# Patient Record
Sex: Female | Born: 1975 | Race: Black or African American | Hispanic: No | Smoking: Never smoker
Health system: Southern US, Community
[De-identification: ages and names within clinical notes are randomized; demographics above are authoritative.]

## PROBLEM LIST (undated history)

## (undated) DIAGNOSIS — M109 Gout, unspecified: Secondary | ICD-10-CM

## (undated) DIAGNOSIS — I499 Cardiac arrhythmia, unspecified: Secondary | ICD-10-CM

## (undated) DIAGNOSIS — H6590 Unspecified nonsuppurative otitis media, unspecified ear: Secondary | ICD-10-CM

## (undated) DIAGNOSIS — G629 Polyneuropathy, unspecified: Secondary | ICD-10-CM

## (undated) DIAGNOSIS — G43909 Migraine, unspecified, not intractable, without status migrainosus: Secondary | ICD-10-CM

## (undated) DIAGNOSIS — E785 Hyperlipidemia, unspecified: Secondary | ICD-10-CM

## (undated) DIAGNOSIS — Z8639 Personal history of other endocrine, nutritional and metabolic disease: Secondary | ICD-10-CM

## (undated) DIAGNOSIS — M5430 Sciatica, unspecified side: Secondary | ICD-10-CM

## (undated) DIAGNOSIS — Z5189 Encounter for other specified aftercare: Secondary | ICD-10-CM

## (undated) DIAGNOSIS — G5600 Carpal tunnel syndrome, unspecified upper limb: Secondary | ICD-10-CM

## (undated) DIAGNOSIS — E119 Type 2 diabetes mellitus without complications: Secondary | ICD-10-CM

## (undated) DIAGNOSIS — M5020 Other cervical disc displacement, unspecified cervical region: Secondary | ICD-10-CM

## (undated) DIAGNOSIS — J45909 Unspecified asthma, uncomplicated: Secondary | ICD-10-CM

## (undated) DIAGNOSIS — I10 Essential (primary) hypertension: Secondary | ICD-10-CM

## (undated) HISTORY — PX: ABDOMINAL HYSTERECTOMY: SHX81

## (undated) HISTORY — PX: APPENDECTOMY: SHX54

## (undated) HISTORY — PX: CARPAL TUNNEL RELEASE: SHX101

## (undated) HISTORY — PX: TRIGGER FINGER RELEASE: SHX641

## (undated) HISTORY — PX: CHOLECYSTECTOMY: SHX55

## (undated) HISTORY — DX: Personal history of other endocrine, nutritional and metabolic disease: Z86.39

## (undated) HISTORY — PX: TUBAL LIGATION: SHX77

## (undated) HISTORY — DX: Hyperlipidemia, unspecified: E78.5

## (undated) HISTORY — DX: Cardiac arrhythmia, unspecified: I49.9

---
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
## 1993-03-27 HISTORY — PX: CHOLECYSTECTOMY: SHX55

## 1995-03-28 HISTORY — PX: TUBAL LIGATION: SHX77

## 2006-03-27 HISTORY — PX: CARPAL TUNNEL RELEASE: SHX101

## 2008-03-27 DIAGNOSIS — Z5189 Encounter for other specified aftercare: Secondary | ICD-10-CM

## 2008-03-27 HISTORY — PX: APPENDECTOMY: SHX54

## 2008-03-27 HISTORY — PX: ABDOMINAL HYSTERECTOMY: SHX81

## 2008-03-27 HISTORY — DX: Encounter for other specified aftercare: Z51.89

## 2018-05-20 ENCOUNTER — Emergency Department (HOSPITAL_COMMUNITY)
Admission: EM | Admit: 2018-05-20 | Discharge: 2018-05-20 | Disposition: A | Discharge status: Home / Self Care | Payer: Self-pay | Attending: Emergency Medicine | Admitting: Emergency Medicine

## 2018-05-20 ENCOUNTER — Encounter (HOSPITAL_COMMUNITY): Payer: Self-pay

## 2018-05-20 ENCOUNTER — Other Ambulatory Visit: Payer: Self-pay

## 2018-05-20 DIAGNOSIS — R519 Headache, unspecified: Secondary | ICD-10-CM

## 2018-05-20 DIAGNOSIS — R51 Headache: Secondary | ICD-10-CM | POA: Insufficient documentation

## 2018-05-20 DIAGNOSIS — Z9049 Acquired absence of other specified parts of digestive tract: Secondary | ICD-10-CM | POA: Insufficient documentation

## 2018-05-20 DIAGNOSIS — I10 Essential (primary) hypertension: Secondary | ICD-10-CM | POA: Insufficient documentation

## 2018-05-20 DIAGNOSIS — E119 Type 2 diabetes mellitus without complications: Secondary | ICD-10-CM | POA: Insufficient documentation

## 2018-05-20 DIAGNOSIS — J45909 Unspecified asthma, uncomplicated: Secondary | ICD-10-CM | POA: Insufficient documentation

## 2018-05-20 HISTORY — DX: Carpal tunnel syndrome, unspecified upper limb: G56.00

## 2018-05-20 HISTORY — DX: Unspecified nonsuppurative otitis media, unspecified ear: H65.90

## 2018-05-20 HISTORY — DX: Essential (primary) hypertension: I10

## 2018-05-20 HISTORY — DX: Migraine, unspecified, not intractable, without status migrainosus: G43.909

## 2018-05-20 HISTORY — DX: Sciatica, unspecified side: M54.30

## 2018-05-20 HISTORY — DX: Polyneuropathy, unspecified: G62.9

## 2018-05-20 HISTORY — DX: Gout, unspecified: M10.9

## 2018-05-20 HISTORY — DX: Type 2 diabetes mellitus without complications: E11.9

## 2018-05-20 HISTORY — DX: Other cervical disc displacement, unspecified cervical region: M50.20

## 2018-05-20 HISTORY — DX: Unspecified asthma, uncomplicated: J45.909

## 2018-05-20 LAB — CBG MONITORING, ED: Glucose-Capillary: 80 mg/dL (ref 70–99)

## 2018-05-20 MED ORDER — METOCLOPRAMIDE HCL 5 MG/ML IJ SOLN
10.0000 mg | Freq: Once | INTRAMUSCULAR | Status: AC
  Administered 2018-05-20: 10 mg via INTRAVENOUS
  Filled 2018-05-20: qty 2

## 2018-05-20 MED ORDER — SODIUM CHLORIDE 0.9 % IV BOLUS
1000.0000 mL | Freq: Once | INTRAVENOUS | Status: AC
  Administered 2018-05-20: 1000 mL via INTRAVENOUS

## 2018-05-20 MED ORDER — ONDANSETRON HCL 4 MG/2ML IJ SOLN: 4.0000 mg | Freq: Once | INTRAMUSCULAR | Status: DC

## 2018-05-20 MED ORDER — DIPHENHYDRAMINE HCL 50 MG/ML IJ SOLN
25.0000 mg | Freq: Once | INTRAMUSCULAR | Status: AC
  Administered 2018-05-20: 25 mg via INTRAVENOUS
  Filled 2018-05-20: qty 1

## 2018-05-20 MED ORDER — METOCLOPRAMIDE HCL 10 MG PO TABS: 10.0000 mg | ORAL_TABLET | Freq: Four times a day (QID) | ORAL | 0 refills | Status: DC | PRN

## 2018-05-20 NOTE — ED Triage Notes (Signed)
Pt states that she has had a migraine x 2 months. Pt states that this is worse that it's been. Pt c/o nausea, vomiting with migraines.

## 2018-05-20 NOTE — ED Notes (Signed)
Pt ambulated independently to bathroom for urine sample.

## 2018-05-20 NOTE — Discharge Instructions (Addendum)
Please read instructions below. You can take 600 mg of Advil/ibuprofen every 6 hours as needed for headache. You can take Reglan every 6 hours as needed for headache.  This medication is on the $4 list at [employer], cash price. You can take 25 mg of Benadryl with this to avoid the side effects. Schedule an appointment with your primary care provider to follow up on your headache and discuss preventative treatment. Return to the ER for severely worsening headache, vision changes, fever, weakness or numbness, or new or concerning symptoms.

## 2018-05-20 NOTE — ED Provider Notes (Signed)
[facility]-EMERGENCY DEPT Provider Note   CSN: [id number] Arrival date & time: 05/20/18  1240    History   Chief Complaint Chief Complaint  Patient presents with  . Migraine    HPI Lisa Kennedy is a 43 y.o. female with past medical history of diabetes, asthma, hypertension, migraine headaches, presenting to the emergency department with complaint of constant waxing and waning headache x2 months.  Pain is located to the right side mostly, described as throbbing.  She states she has history of migraine headaches since he was young, normally has about 2 headaches per week.  Has been treating headache at home with ibuprofen, naproxen, Tylenol which eases the headache, however does not completely resolve it.  Patient reports associated photophobia, phonophobia, and intermittent nausea with vomiting.  She states these are her typical symptoms for her migraine headache, the only difference is that her headache is not completely resolving.  [facility], who prescribed her Fioricet, however she cannot afford this medication without insurance.  Prescribed Lortab the past which helped, however she cannot afford this either.  No fever, double vision, slurred speech, facial droop.  No head injury.     The history is provided by the patient.    Past Medical History:  Diagnosis Date  . Asthma   . Carpal tunnel syndrome   . Diabetes mellitus without complication (HCC)   . Fluid collection of middle ear   . Gout   . Herniated disc, cervical   . Hypertension   . Migraine   . Neuropathy   . Sciatica     There are no active problems to display for this patient.   Past Surgical History:  Procedure Laterality Date  . ABDOMINAL HYSTERECTOMY    . APPENDECTOMY    . CARPAL TUNNEL RELEASE    . CHOLECYSTECTOMY    . TRIGGER FINGER RELEASE    . TUBAL LIGATION       OB History   No obstetric history on file.      Home Medications    Prior  to Admission medications   Medication Sig Start Date End Date Taking? Authorizing Provider  metoCLOPramide (REGLAN) 10 MG tablet Take 1 tablet (10 mg total) by mouth every 6 (six) hours as needed (headache or nausea). 05/20/18   Nikolos Billig, [place] N, PA-C    Family History No family history on file.  Social History Social History   Tobacco Use  . Smoking status: Never Smoker  . Smokeless tobacco: Never Used  Substance Use Topics  . Alcohol use: Never    Frequency: Never  . Drug use: Never     Allergies   Imitrex [sumatriptan]; Morphine and related; Penicillins; and Toradol [ketorolac tromethamine]   Review of Systems Review of Systems  Constitutional: Negative for fever.  Eyes: Positive for photophobia. Negative for visual disturbance.  Gastrointestinal: Positive for nausea and vomiting. Negative for abdominal pain.  Neurological: Positive for headaches.  All other systems reviewed and are negative.    Physical Exam Updated Vital Signs BP (!) 145/92   Pulse 98   Temp 98.2 F (36.8 C) (Oral)   Resp 16   Ht 5' 4.5" (1.638 m)   Wt 129.6 kg   SpO2 99%   BMI 48.30 kg/m   Physical Exam Vitals signs and nursing note reviewed.  Constitutional:      General: She is not in acute distress.    Appearance: She is well-developed. She is not ill-appearing.  Comments: Morbidly obese  HENT:     Head: Normocephalic and atraumatic.  Eyes:     Extraocular Movements: Extraocular movements intact.     Conjunctiva/sclera: Conjunctivae normal.     Pupils: Pupils are equal, round, and reactive to light.  Neck:     Musculoskeletal: Normal range of motion and neck supple. No neck rigidity.  Cardiovascular:     Rate and Rhythm: Normal rate and regular rhythm.  Pulmonary:     Effort: Pulmonary effort is normal. No respiratory distress.     Breath sounds: Normal breath sounds.  Abdominal:     General: Bowel sounds are normal. There is no distension.     Palpations: Abdomen is  soft.     Tenderness: There is no abdominal tenderness. There is no guarding.  Skin:    General: Skin is warm.  Neurological:     Mental Status: She is alert.     Comments: Mental Status:  Alert, oriented, thought content appropriate, able to give a coherent history. Speech fluent without evidence of aphasia. Able to follow 2 step commands without difficulty.  Cranial Nerves:  II:  Peripheral visual fields grossly normal, pupils equal, round, reactive to light III,IV, VI: ptosis not present, extra-ocular motions intact bilaterally  V,VII: smile symmetric, facial light touch sensation equal VIII: hearing grossly normal to voice  X: uvula elevates symmetrically  XI: bilateral shoulder shrug symmetric and strong XII: midline tongue extension without fassiculations Motor:  Normal tone. 5/5 in upper and lower extremities bilaterally including strong and equal grip strength and dorsiflexion/plantar flexion Sensory: Pinprick and light touch normal in all extremities.  Cerebellar: normal finger-to-nose with bilateral upper extremities Normal gait CV: distal pulses palpable throughout    Psychiatric:        Behavior: Behavior normal.      ED Treatments / Results  Labs (all labs ordered are listed, but only abnormal results are displayed) Labs Reviewed  CBG MONITORING, ED    EKG None  Radiology No results found.  Procedures Procedures (including critical care time)  Medications Ordered in ED Medications  sodium chloride 0.9 % bolus 1,000 mL (0 mLs Intravenous Stopped 05/20/18 1848)  metoCLOPramide (REGLAN) injection 10 mg (10 mg Intravenous Given 05/20/18 1729)  diphenhydrAMINE (BENADRYL) injection 25 mg (25 mg Intravenous Given 05/20/18 1729)     Initial Impression / Assessment and Plan / ED Course  I have reviewed the triage vital signs and the nursing notes.  Pertinent labs & imaging results that were available during my care of the patient were reviewed by me and  considered in my medical decision making (see chart for details).  Clinical Course as of May 20 1938  Mon May 20, 2018  1701 2 months waxing and waning headache. Iburpfoen., naproxdn, tylenol. Eases but not rseolved. Photophobia 1.16months, phono. N/v started 3 weeks, comes and goes. Prior to this about 2 ha per week. Managed in [place]. Loratab for headache, no insurance so can't afford. [facility]cp at [facility] -fioricet.    [JR]  1704 Int blurryvision. No diplopia. No fever. No slurred speech or facial droop.   [JR]  1705 Right side mostly thobbing   [JR]  1900 Patient reevaluated.  Reports complete resolution in symptoms and is ready for discharge.   [JR]    Clinical Course User Index [JR] Verneda Hollopeter, [place] N, PA-C       Pt w hx migraine HA, presenting with the same. Persistent waxing/waning sx x 2 months.  HA treated and  resolved while in ED.  Presentation is like pts typical HA and non concerning for [facility], ICH, Meningitis, or temporal arteritis. Pt is afebrile with no focal neuro deficits, nuchal rigidity, or change in vision. Pt is to follow up with PCP to discuss prophylactic medication. Pt verbalizes understanding and is agreeable with plan to dc.   Discussed results, findings, treatment and follow up. Patient advised of return precautions. Patient verbalized understanding and agreed with plan.  Final Clinical Impressions(s) / ED Diagnoses   Final diagnoses:  Acute nonintractable headache, unspecified headache type    ED Discharge Orders         Ordered    metoCLOPramide (REGLAN) 10 MG tablet  Every 6 hours PRN     05/20/18 1902           Keyondra Lagrand, [place] N, PA-C 05/20/18 1941    Mancel Bale, MD 05/21/18 [phone]

## 2018-06-04 ENCOUNTER — Other Ambulatory Visit (HOSPITAL_COMMUNITY): Payer: Self-pay | Admitting: *Deleted

## 2018-06-04 ENCOUNTER — Telehealth (HOSPITAL_COMMUNITY): Payer: Self-pay

## 2018-06-04 DIAGNOSIS — N644 Mastodynia: Secondary | ICD-10-CM

## 2018-06-04 NOTE — Telephone Encounter (Signed)
Returned patient's phone call. Left name and number for her to call back. °

## 2018-07-22 ENCOUNTER — Telehealth (HOSPITAL_COMMUNITY): Payer: Self-pay | Admitting: *Deleted

## 2018-07-22 NOTE — Telephone Encounter (Signed)
Telephoned patient at home number and left message to return call to [facility]. Need to speak to patient prior to appointment otherwise will cancel appointment.  

## 2018-07-23 ENCOUNTER — Other Ambulatory Visit: Payer: Self-pay

## 2018-07-23 ENCOUNTER — Ambulatory Visit (HOSPITAL_COMMUNITY): Payer: Self-pay

## 2018-07-23 ENCOUNTER — Inpatient Hospital Stay: Admission: RE | Admit: 2018-07-23 | Payer: Self-pay | Source: Ambulatory Visit

## 2018-07-25 ENCOUNTER — Other Ambulatory Visit: Payer: Self-pay

## 2018-10-31 ENCOUNTER — Other Ambulatory Visit: Payer: Self-pay

## 2018-10-31 ENCOUNTER — Ambulatory Visit (HOSPITAL_COMMUNITY): Payer: Self-pay

## 2018-11-20 ENCOUNTER — Emergency Department (HOSPITAL_COMMUNITY)
Admission: EM | Admit: 2018-11-20 | Discharge: 2018-11-20 | Disposition: A | Discharge status: Home / Self Care | Payer: 59 | Attending: Emergency Medicine | Admitting: Emergency Medicine

## 2018-11-20 ENCOUNTER — Other Ambulatory Visit: Payer: Self-pay

## 2018-11-20 ENCOUNTER — Encounter (HOSPITAL_COMMUNITY): Payer: Self-pay | Admitting: Emergency Medicine

## 2018-11-20 ENCOUNTER — Emergency Department (HOSPITAL_COMMUNITY): Payer: 59

## 2018-11-20 DIAGNOSIS — Z794 Long term (current) use of insulin: Secondary | ICD-10-CM | POA: Diagnosis not present

## 2018-11-20 DIAGNOSIS — I10 Essential (primary) hypertension: Secondary | ICD-10-CM | POA: Insufficient documentation

## 2018-11-20 DIAGNOSIS — Z79899 Other long term (current) drug therapy: Secondary | ICD-10-CM | POA: Diagnosis not present

## 2018-11-20 DIAGNOSIS — Z20828 Contact with and (suspected) exposure to other viral communicable diseases: Secondary | ICD-10-CM | POA: Diagnosis not present

## 2018-11-20 DIAGNOSIS — E119 Type 2 diabetes mellitus without complications: Secondary | ICD-10-CM | POA: Insufficient documentation

## 2018-11-20 DIAGNOSIS — R112 Nausea with vomiting, unspecified: Secondary | ICD-10-CM | POA: Diagnosis present

## 2018-11-20 DIAGNOSIS — J45909 Unspecified asthma, uncomplicated: Secondary | ICD-10-CM | POA: Diagnosis not present

## 2018-11-20 DIAGNOSIS — B349 Viral infection, unspecified: Secondary | ICD-10-CM | POA: Diagnosis not present

## 2018-11-20 LAB — BASIC METABOLIC PANEL
Anion gap: 16 — ABNORMAL HIGH (ref 5–15)
BUN: 17 mg/dL (ref 6–20)
CO2: 24 mmol/L (ref 22–32)
Calcium: 9 mg/dL (ref 8.9–10.3)
Chloride: 96 mmol/L — ABNORMAL LOW (ref 98–111)
Creatinine, Ser: 1.35 mg/dL — ABNORMAL HIGH (ref 0.44–1.00)
GFR calc Af Amer: 55 mL/min — ABNORMAL LOW (ref >60)
GFR calc non Af Amer: 47 mL/min — ABNORMAL LOW (ref >60)
Glucose, Bld: 252 mg/dL — ABNORMAL HIGH (ref 70–99)
Potassium: 3.7 mmol/L (ref 3.5–5.1)
Sodium: 136 mmol/L (ref 135–145)

## 2018-11-20 LAB — TROPONIN I (HIGH SENSITIVITY)
Troponin I (High Sensitivity): 4 ng/L (ref <18)
Troponin I (High Sensitivity): 3 ng/L (ref <18)

## 2018-11-20 LAB — CBC
HCT: 39.7 % (ref 36.0–46.0)
Hemoglobin: 12.7 g/dL (ref 12.0–15.0)
MCH: 28.1 pg (ref 26.0–34.0)
MCHC: 32 g/dL (ref 30.0–36.0)
MCV: 87.8 fL (ref 80.0–100.0)
Platelets: 341 10*3/uL (ref 150–400)
RBC: 4.52 MIL/uL (ref 3.87–5.11)
RDW: 12.9 % (ref 11.5–15.5)
WBC: 6.3 10*3/uL (ref 4.0–10.5)
nRBC: 0 % (ref 0.0–0.2)

## 2018-11-20 LAB — I-STAT BETA HCG BLOOD, ED (MC, WL, AP ONLY): I-stat hCG, quantitative: <5 m[IU]/mL (ref <5)

## 2018-11-20 MED ORDER — SODIUM CHLORIDE 0.9 % IV BOLUS
1000.0000 mL | Freq: Once | INTRAVENOUS | Status: AC
  Administered 2018-11-20: 1000 mL via INTRAVENOUS

## 2018-11-20 MED ORDER — ONDANSETRON HCL 4 MG/2ML IJ SOLN
4.0000 mg | Freq: Once | INTRAMUSCULAR | Status: AC
  Administered 2018-11-20: 4 mg via INTRAVENOUS
  Filled 2018-11-20: qty 2

## 2018-11-20 MED ORDER — ONDANSETRON HCL 4 MG PO TABS: 4.0000 mg | ORAL_TABLET | Freq: Four times a day (QID) | ORAL | 0 refills | Status: DC

## 2018-11-20 MED ORDER — SODIUM CHLORIDE 0.9% FLUSH
3.0000 mL | Freq: Once | INTRAVENOUS | Status: AC
  Administered 2018-11-20: 3 mL via INTRAVENOUS

## 2018-11-20 NOTE — ED Triage Notes (Signed)
Pt here from home with c/o sob and chest pain , pt has asthma and has been using her inhaler without relief pt.'s daughter has been tested for covid awaiting results

## 2018-11-20 NOTE — Discharge Instructions (Signed)
Please read attached information. If you experience any new or worsening signs or symptoms please return to the emergency room for evaluation. Please follow-up with your primary care provider or specialist as discussed. Please use medication prescribed only as directed and discontinue taking if you have any concerning signs or symptoms.   °

## 2018-11-20 NOTE — ED Notes (Signed)
Patient verbalizes understanding of discharge instructions. Opportunity for questioning and answers were provided. Armband removed by staff, pt discharged from ED.  

## 2018-11-20 NOTE — ED Provider Notes (Signed)
[facility] EMERGENCY DEPARTMENT Provider Note   CSN: [id number] Arrival date & time: 11/20/18  1129     History   Chief Complaint No chief complaint on file.   HPI Lisa Kennedy is a 43 y.o. female.     HPI  43 year old female presents today with numerous complaints.  Patient notes approximate 1 week ago she developed nausea vomiting diarrhea.  She notes that the vomiting and diarrhea is gone away but she still feels nauseous.  She is able to eat and drink.  She notes 3 days ago she developed cough with minimal shortness of breath, the shortness of breath is only present when she is talking and not at rest.  She denies any lower extremity edema she notes intermittent ache in her middle chest.  She denies any fever.  She denies any recent close contact with COVID positive patients but does note that her daughter currently has URI and is being tested.  She is a diabetic and did not take her insulin this morning.  She notes she is slightly thirsty.  Past Medical History:  Diagnosis Date  . Asthma   . Blood transfusion without reported diagnosis 03/2008   During Hysterectomy   . Carpal tunnel syndrome   . Diabetes mellitus without complication (HCC)   . Fluid collection of middle ear   . Gout   . Herniated disc, cervical   . Hypertension   . Migraine   . Neuropathy   . Sciatica     There are no active problems to display for this patient.   Past Surgical History:  Procedure Laterality Date  . ABDOMINAL HYSTERECTOMY    . APPENDECTOMY    . CARPAL TUNNEL RELEASE    . CHOLECYSTECTOMY    . TRIGGER FINGER RELEASE    . TUBAL LIGATION       OB History   No obstetric history on file.      Home Medications    Prior to Admission medications   Medication Sig Start Date End Date Taking? Authorizing Provider  allopurinol (ZYLOPRIM) 100 MG tablet Take 100 mg by mouth daily.   Yes [provider]  atorvastatin (LIPITOR) 40 MG tablet Take 40 mg by  mouth daily.   Yes [provider]  cetirizine (ZYRTEC) 10 MG tablet Take 10 mg by mouth daily.   Yes [provider]  cyclobenzaprine (FLEXERIL) 10 MG tablet Take 10 mg by mouth daily.   Yes [provider]  diphenhydrAMINE (BENADRYL) 25 MG tablet Take 25 mg by mouth every 6 (six) hours as needed for allergies.   Yes [provider]  famotidine (PEPCID) 40 MG tablet Take 40 mg by mouth 2 (two) times daily.   Yes [provider]  gabapentin (NEURONTIN) 300 MG capsule Take 300 mg by mouth 4 (four) times daily.   Yes [provider]  insulin aspart (NOVOLOG FLEXPEN) 100 UNIT/ML FlexPen Inject 2-14 Units into the skin 3 (three) times daily with meals. Sliding Scale   Yes [provider]  insulin glargine (LANTUS) 100 UNIT/ML injection Inject 40 Units into the skin at bedtime.   Yes [provider]  lisinopril-hydrochlorothiazide (ZESTORETIC) 20-12.5 MG tablet Take 1 tablet by mouth 2 (two) times daily.   Yes [provider]  metFORMIN (GLUCOPHAGE) 1000 MG tablet Take 1,000 mg by mouth 2 (two) times daily with a meal.   Yes [provider]  metoCLOPramide (REGLAN) 10 MG tablet Take 1 tablet (10 mg total) by  mouth every 6 (six) hours as needed (headache or nausea). 05/20/18  Yes Robinson, [place] N, PA-C  metoprolol tartrate (LOPRESSOR) 25 MG tablet Take 25 mg by mouth 2 (two) times daily.   Yes [provider]  montelukast (SINGULAIR) 10 MG tablet Take 10 mg by mouth daily.   Yes [provider]  Vitamin D, Ergocalciferol, (DRISDOL) 1.25 MG (50000 UT) CAPS capsule Take 50,000 Units by mouth every 7 (seven) days.   Yes [provider]  ondansetron (ZOFRAN) 4 MG tablet Take 1 tablet (4 mg total) by mouth every 6 (six) hours. 11/20/18   Okey Regal, PA-C    Family History History reviewed. No pertinent family history.  Social History Social History   Tobacco Use  . Smoking status:  Never Smoker  . Smokeless tobacco: Never Used  Substance Use Topics  . Alcohol use: Never    Frequency: Never  . Drug use: Never     Allergies   Coconut oil, Imitrex [sumatriptan], Morphine and related, Penicillins, and Toradol [ketorolac tromethamine]   Review of Systems Review of Systems  All other systems reviewed and are negative.    Physical Exam Updated Vital Signs BP 110/62   Pulse 95   Temp 98.8 F (37.1 C) (Bladder)   Resp 20   Ht 5' 4.5" (1.638 m)   Wt 127.5 kg   SpO2 100%   BMI 47.49 kg/m   Physical Exam Vitals signs and nursing note reviewed.  Constitutional:      Appearance: She is well-developed.  HENT:     Head: Normocephalic and atraumatic.  Eyes:     General: No scleral icterus.       Right eye: No discharge.        Left eye: No discharge.     Conjunctiva/sclera: Conjunctivae normal.     Pupils: Pupils are equal, round, and reactive to light.  Neck:     Musculoskeletal: Normal range of motion.     Vascular: No JVD.     Trachea: No tracheal deviation.  Pulmonary:     Effort: Pulmonary effort is normal. No respiratory distress.     Breath sounds: Normal breath sounds. No stridor. No wheezing, rhonchi or rales.  Chest:     Chest wall: No tenderness.  Musculoskeletal:     Comments: No edema to the lower extremities  Neurological:     Mental Status: She is alert and oriented to person, place, and time.     Coordination: Coordination normal.  Psychiatric:        Behavior: Behavior normal.        Thought Content: Thought content normal.        Judgment: Judgment normal.      ED Treatments / Results  Labs (all labs ordered are listed, but only abnormal results are displayed) Labs Reviewed  BASIC METABOLIC PANEL - Abnormal; Notable for the following components:      Result Value   Chloride 96 (*)    Glucose, Bld 252 (*)    Creatinine, Ser 1.35 (*)    GFR calc non Af Amer 47 (*)    GFR calc Af Amer 55 (*)    Anion gap 16 (*)    All  other components within normal limits  NOVEL CORONAVIRUS, NAA (HOSP ORDER, SEND-OUT TO REF LAB; TAT 18-24 HRS)  CBC  I-STAT BETA HCG BLOOD, ED ([facility], [facility], [facility] ONLY)  TROPONIN I (HIGH SENSITIVITY)  TROPONIN I (HIGH SENSITIVITY)    EKG None  Radiology  Dg Chest Port 1 View  Result Date: 11/20/2018 CLINICAL DATA:  Chest pain, cough EXAM: PORTABLE CHEST 1 VIEW COMPARISON:  None. FINDINGS: The heart size and mediastinal contours are within normal limits. Both lungs are clear. The visualized skeletal structures are unremarkable. IMPRESSION: No acute abnormality of the lungs in AP portable projection. Electronically Signed   By: Lauralyn PrimesAlex  Bibbey M.D.   On: 11/20/2018 13:41    Procedures Procedures (including critical care time)  Medications Ordered in ED Medications  sodium chloride flush (NS) 0.9 % injection 3 mL (3 mLs Intravenous Given 11/20/18 1358)  sodium chloride 0.9 % bolus 1,000 mL (0 mLs Intravenous Stopped 11/20/18 1553)  ondansetron (ZOFRAN) injection 4 mg (4 mg Intravenous Given 11/20/18 1357)     Initial Impression / Assessment and Plan / ED Course  I have reviewed the triage vital signs and the nursing notes.  Pertinent labs & imaging results that were available during my care of the patient were reviewed by me and considered in my medical decision making (see chart for details).          Assessment/Plan: 43 year old female presents today with likely viral illness.  She appears very well in no acute distress presently.  She has clear lung sounds speaking full sentences with no signs of respiratory distress, vital signs reassuring with oxygen at 100%.  Chest x-ray without acute findings.  Patient did have nausea vomiting diarrhea followed by upper respiratory symptoms giving rise to viral illness.  Will test for COVID.  Patient will return immediately if she develops any new or worsening signs or symptoms.  Verbalized understanding and agreement to today's plan had no further  questions concerns at time of discharge.  Lisa Kennedy was evaluated in Emergency Department on 11/20/2018 for the symptoms described in the history of present illness. She was evaluated in the context of the global COVID-19 pandemic, which necessitated consideration that the patient might be at risk for infection with the SARS-CoV-2 virus that causes COVID-19. Institutional protocols and algorithms that pertain to the evaluation of patients at risk for COVID-19 are in a state of rapid change based on information released by regulatory bodies including the CDC and federal and state organizations. These policies and algorithms were followed during the patient's care in the ED.      Final Clinical Impressions(s) / ED Diagnoses   Final diagnoses:  Viral illness    ED Discharge Orders         Ordered    ondansetron (ZOFRAN) 4 MG tablet  Every 6 hours     11/20/18 1617           Eyvonne MechanicHedges, Juandaniel Manfredo, PA-C 11/20/18 1619    Loren RacerYelverton, David, MD 11/25/18 2232

## 2018-11-21 LAB — NOVEL CORONAVIRUS, NAA (HOSP ORDER, SEND-OUT TO REF LAB; TAT 18-24 HRS): SARS-CoV-2, NAA: NOT DETECTED

## 2019-01-28 ENCOUNTER — Other Ambulatory Visit: Payer: Self-pay

## 2019-01-28 ENCOUNTER — Ambulatory Visit (HOSPITAL_COMMUNITY)
Admission: RE | Admit: 2019-01-28 | Discharge: 2019-01-28 | Disposition: A | Discharge status: Home / Self Care | Payer: 59 | Source: Ambulatory Visit | Attending: Obstetrics and Gynecology | Admitting: Obstetrics and Gynecology

## 2019-01-28 ENCOUNTER — Encounter (HOSPITAL_COMMUNITY): Payer: Self-pay

## 2019-01-28 ENCOUNTER — Ambulatory Visit
Admission: RE | Admit: 2019-01-28 | Discharge: 2019-01-28 | Disposition: A | Discharge status: Home / Self Care | Payer: No Typology Code available for payment source | Source: Ambulatory Visit | Attending: Obstetrics and Gynecology | Admitting: Obstetrics and Gynecology

## 2019-01-28 ENCOUNTER — Ambulatory Visit
Admission: RE | Admit: 2019-01-28 | Discharge: 2019-01-28 | Disposition: A | Discharge status: Home / Self Care | Payer: 59 | Source: Ambulatory Visit | Attending: Obstetrics and Gynecology | Admitting: Obstetrics and Gynecology

## 2019-01-28 ENCOUNTER — Other Ambulatory Visit (HOSPITAL_COMMUNITY): Payer: Self-pay | Admitting: Obstetrics and Gynecology

## 2019-01-28 DIAGNOSIS — N644 Mastodynia: Secondary | ICD-10-CM

## 2019-01-28 DIAGNOSIS — Z1239 Encounter for other screening for malignant neoplasm of breast: Secondary | ICD-10-CM | POA: Insufficient documentation

## 2019-01-28 DIAGNOSIS — N6320 Unspecified lump in the left breast, unspecified quadrant: Secondary | ICD-10-CM

## 2019-01-28 NOTE — Progress Notes (Signed)
Complaints of bilateral breast pain x 6 months that is within the left outer and right nipple area. Patient states the pain comes and goes. Patient rates the pain at a 6 out of 10.  Pap Smear: Pap smear not completed today. Last Pap smear was in March 2020 at [facility] and normal per patient. Per patient has no history of an abnormal Pap smear. Patient stated she had a hysterectomy in 2010 for AUB and that she still has a cervix. No Pap smear results are in Epic.  Physical exam: Breasts Breasts symmetrical. No skin abnormalities bilateral breasts. No nipple retraction bilateral breasts. No nipple discharge bilateral breasts. No lymphadenopathy. No lumps palpated bilateral breasts. Complaints of left outer and right upper breast tenderness on exam. Referred patient to the [facility] for a diagnostic mammogram. Appointment scheduled for Tuesday, January 28, 2019 at 0950.        Pelvic/Bimanual No Pap smear completed today since last Pap smear was in March 2020 per patient. Pap smear not indicated per [facility] guidelines.   Smoking History: Patient has never smoked.  Patient Navigation: Patient education provided. Access to services provided for patient through [facility] program.   Breast and Cervical Cancer Risk Assessment: Patient has a family history of her maternal grandmother and maternal great grandmother having breast cancer. Patient has no known genetic mutations or history of radiation treatment to the chest before age 52. Patient has no history of cervical dysplasia, immunocompromised, or DES exposure in-utero.  Risk Assessment    Risk Scores      01/28/2019   Last edited by: Loletta Parish, RN   5-year risk: 0.9 %   Lifetime risk: 9.3 %

## 2019-01-28 NOTE — Patient Instructions (Signed)
Explained breast self awareness with [employer]. Patient did not need a Pap smear today due to last Pap smear was in March 2020 per patient. Let her know [facility] will cover Pap smears every 3 years unless has a history of abnormal Pap smears. Referred patient to the [facility] for a diagnostic mammogram. Appointment scheduled for Tuesday, January 28, 2019 at 0950. Patient aware of appointment and will be there. [employer] verbalized understanding.  Caydn Justen, Arvil Chaco, RN 8:21 AM

## 2019-07-29 ENCOUNTER — Other Ambulatory Visit: Payer: No Typology Code available for payment source

## 2019-08-01 ENCOUNTER — Other Ambulatory Visit: Payer: Self-pay

## 2019-08-01 ENCOUNTER — Encounter (HOSPITAL_COMMUNITY): Payer: Self-pay | Admitting: Emergency Medicine

## 2019-08-01 ENCOUNTER — Emergency Department (HOSPITAL_COMMUNITY)
Admission: EM | Admit: 2019-08-01 | Discharge: 2019-08-01 | Disposition: A | Discharge status: Home / Self Care | Payer: 59 | Attending: Emergency Medicine | Admitting: Emergency Medicine

## 2019-08-01 DIAGNOSIS — E119 Type 2 diabetes mellitus without complications: Secondary | ICD-10-CM | POA: Diagnosis not present

## 2019-08-01 DIAGNOSIS — R350 Frequency of micturition: Secondary | ICD-10-CM | POA: Insufficient documentation

## 2019-08-01 DIAGNOSIS — E1165 Type 2 diabetes mellitus with hyperglycemia: Secondary | ICD-10-CM | POA: Diagnosis not present

## 2019-08-01 DIAGNOSIS — Z794 Long term (current) use of insulin: Secondary | ICD-10-CM | POA: Diagnosis not present

## 2019-08-01 DIAGNOSIS — H538 Other visual disturbances: Secondary | ICD-10-CM | POA: Diagnosis not present

## 2019-08-01 DIAGNOSIS — R1013 Epigastric pain: Secondary | ICD-10-CM | POA: Diagnosis present

## 2019-08-01 DIAGNOSIS — R112 Nausea with vomiting, unspecified: Secondary | ICD-10-CM | POA: Diagnosis not present

## 2019-08-01 DIAGNOSIS — Z79899 Other long term (current) drug therapy: Secondary | ICD-10-CM | POA: Insufficient documentation

## 2019-08-01 DIAGNOSIS — I10 Essential (primary) hypertension: Secondary | ICD-10-CM | POA: Insufficient documentation

## 2019-08-01 LAB — URINALYSIS, ROUTINE W REFLEX MICROSCOPIC
Bacteria, UA: NONE SEEN
Bilirubin Urine: NEGATIVE
Glucose, UA: >=500 mg/dL — AB
Hgb urine dipstick: NEGATIVE
Ketones, ur: NEGATIVE mg/dL
Leukocytes,Ua: NEGATIVE
Nitrite: NEGATIVE
Protein, ur: NEGATIVE mg/dL
Specific Gravity, Urine: 1.026 (ref 1.005–1.030)
pH: 5 (ref 5.0–8.0)

## 2019-08-01 LAB — BASIC METABOLIC PANEL
Anion gap: 15 (ref 5–15)
BUN: 22 mg/dL — ABNORMAL HIGH (ref 6–20)
CO2: 25 mmol/L (ref 22–32)
Calcium: 9.7 mg/dL (ref 8.9–10.3)
Chloride: 94 mmol/L — ABNORMAL LOW (ref 98–111)
Creatinine, Ser: 1.04 mg/dL — ABNORMAL HIGH (ref 0.44–1.00)
GFR calc Af Amer: >60 mL/min (ref >60)
GFR calc non Af Amer: >60 mL/min (ref >60)
Glucose, Bld: 258 mg/dL — ABNORMAL HIGH (ref 70–99)
Potassium: 3.5 mmol/L (ref 3.5–5.1)
Sodium: 134 mmol/L — ABNORMAL LOW (ref 135–145)

## 2019-08-01 LAB — CBC
HCT: 42.7 % (ref 36.0–46.0)
Hemoglobin: 13.7 g/dL (ref 12.0–15.0)
MCH: 27.5 pg (ref 26.0–34.0)
MCHC: 32.1 g/dL (ref 30.0–36.0)
MCV: 85.7 fL (ref 80.0–100.0)
Platelets: 421 10*3/uL — ABNORMAL HIGH (ref 150–400)
RBC: 4.98 MIL/uL (ref 3.87–5.11)
RDW: 13.7 % (ref 11.5–15.5)
WBC: 6.9 10*3/uL (ref 4.0–10.5)
nRBC: 0 % (ref 0.0–0.2)

## 2019-08-01 LAB — LIPASE, BLOOD: Lipase: 34 U/L (ref 11–51)

## 2019-08-01 LAB — HEPATIC FUNCTION PANEL
ALT: 13 U/L (ref 0–44)
AST: 16 U/L (ref 15–41)
Albumin: 3.6 g/dL (ref 3.5–5.0)
Alkaline Phosphatase: 93 U/L (ref 38–126)
Bilirubin, Direct: 0.1 mg/dL (ref 0.0–0.2)
Indirect Bilirubin: 0.4 mg/dL (ref 0.3–0.9)
Total Bilirubin: 0.5 mg/dL (ref 0.3–1.2)
Total Protein: 7.6 g/dL (ref 6.5–8.1)

## 2019-08-01 LAB — CBG MONITORING, ED
Glucose-Capillary: 278 mg/dL — ABNORMAL HIGH (ref 70–99)
Glucose-Capillary: 268 mg/dL — ABNORMAL HIGH (ref 70–99)

## 2019-08-01 MED ORDER — OMEPRAZOLE 20 MG PO CPDR: 20.0000 mg | DELAYED_RELEASE_CAPSULE | Freq: Every day | ORAL | 0 refills | Status: DC

## 2019-08-01 MED ORDER — ONDANSETRON HCL 4 MG/2ML IJ SOLN
4.0000 mg | Freq: Once | INTRAMUSCULAR | Status: AC
  Administered 2019-08-01: 22:00:00 4 mg via INTRAVENOUS
  Filled 2019-08-01: qty 2

## 2019-08-01 MED ORDER — SODIUM CHLORIDE 0.9 % IV SOLN: 1000.0000 mL | INTRAVENOUS | Status: DC

## 2019-08-01 MED ORDER — ONDANSETRON 4 MG PO TBDP: 4.0000 mg | ORAL_TABLET | ORAL | 0 refills | Status: DC | PRN

## 2019-08-01 MED ORDER — SODIUM CHLORIDE 0.9 % IV BOLUS (SEPSIS)
1000.0000 mL | Freq: Once | INTRAVENOUS | Status: AC
  Administered 2019-08-01: 22:00:00 1000 mL via INTRAVENOUS

## 2019-08-01 MED ORDER — PANTOPRAZOLE SODIUM 40 MG IV SOLR
40.0000 mg | Freq: Once | INTRAVENOUS | Status: AC
  Administered 2019-08-01: 22:00:00 40 mg via INTRAVENOUS
  Filled 2019-08-01: qty 40

## 2019-08-01 NOTE — Discharge Instructions (Addendum)
1.  Continue your for mode of pain twice daily.  Take omeprazole once daily for the next 1 to 2 weeks.  Take Zofran if needed for nausea. 2.  Take your Lantus this evening and then resume your NovoLog tomorrow as prescribed. 3.  Return to the emergency department if you have persistent vomiting, dehydration, feel weak have abdominal pain fever or other concerning symptoms.

## 2019-08-01 NOTE — ED Triage Notes (Signed)
Pt arrives to ED from her PCP with complaints of hyperglycemia. Patient states she hasnt been able to take her Novolog for three weeks due to her pharmacy. Patients BS at her PCP was over 500. Patient was able to get 20units novolog today at PCP office but is here for possible DKA. Patient has been fatigued and weak all week.

## 2019-08-01 NOTE — ED Provider Notes (Signed)
[facility] EMERGENCY DEPARTMENT Provider Note   CSN: [id number] Arrival date & time: 08/01/19  1653     History Chief Complaint  Patient presents with  . Hyperglycemia    Lisa Kennedy is a 44 y.o. female.  HPI Patient reports due to complications or miscommunications between the pharmacy and her primary care office, if she has gone 3 weeks without her NovoLog insulin.  Patient reports this is the third time the situation has arisen.  She reports she has been making phone calls and trying to resolve the situation.  She had an appointment with her provider today.  In the office they found her blood sugar to be greater than 500.  She was given 20 unit dose of NovoLog at PCP office.  She was referred to the emergency department for concern of possible DKA.  Patient reports that she has been having vomiting up to 3-4 times a day for the past week.  She reports it usually occurs if she tries to eat something.  Just today she had a first episode of loose stool.  She reports she has had some epigastric discomfort but no significant abdominal pain.  Reports this does typically happen when her blood sugar start to get out of control.  Reports she is also noticed some blurred vision and frequent urination.  No fevers no chills.  No cough.  No shortness of breath no chest pain.    Past Medical History:  Diagnosis Date  . Asthma   . Blood transfusion without reported diagnosis 03/2008   During Hysterectomy   . Carpal tunnel syndrome   . Diabetes mellitus without complication (HCC)   . Fluid collection of middle ear   . Gout   . Herniated disc, cervical   . Hypertension   . Migraine   . Neuropathy   . Sciatica     Patient Active Problem List   Diagnosis Date Noted  . Screening breast examination 01/28/2019  . Breast pain 01/28/2019    Past Surgical History:  Procedure Laterality Date  . ABDOMINAL HYSTERECTOMY    . APPENDECTOMY    . CARPAL TUNNEL RELEASE    .  CHOLECYSTECTOMY    . TRIGGER FINGER RELEASE    . TUBAL LIGATION       OB History    Gravida  2   Para      Term      Preterm      AB      Living        SAB      TAB      Ectopic      Multiple      Live Births  2           Family History  Problem Relation Age of Onset  . Hypokalemia Mother   . Gout Mother   . Breast cancer Maternal Grandmother   . Diabetes Maternal Great-grandmother     Social History   Tobacco Use  . Smoking status: Never Smoker  . Smokeless tobacco: Never Used  Substance Use Topics  . Alcohol use: Yes    Comment: occassionally  . Drug use: Never    Home Medications Prior to Admission medications   Medication Sig Start Date End Date Taking? Authorizing Provider  allopurinol (ZYLOPRIM) 100 MG tablet Take 100 mg by mouth daily.    [provider]  atorvastatin (LIPITOR) 40 MG tablet Take 40 mg by mouth daily.    [provider]  cetirizine (ZYRTEC) 10 MG tablet Take 10 mg by mouth daily.    [provider]  cyclobenzaprine (FLEXERIL) 10 MG tablet Take 10 mg by mouth daily.    [provider]  diphenhydrAMINE (BENADRYL) 25 MG tablet Take 25 mg by mouth every 6 (six) hours as needed for allergies.    [provider]  famotidine (PEPCID) 40 MG tablet Take 40 mg by mouth 2 (two) times daily.    [provider]  gabapentin (NEURONTIN) 300 MG capsule Take 300 mg by mouth 4 (four) times daily.    [provider]  insulin aspart (NOVOLOG FLEXPEN) 100 UNIT/ML FlexPen Inject 2-14 Units into the skin 3 (three) times daily with meals. Sliding Scale    [provider]  insulin glargine (LANTUS) 100 UNIT/ML injection Inject 40 Units into the skin at bedtime.    [provider]  lisinopril-hydrochlorothiazide (ZESTORETIC) 20-12.5 MG tablet Take 1 tablet by mouth 2 (two) times daily.    [provider]  metFORMIN (GLUCOPHAGE) 1000 MG tablet Take 1,000 mg by mouth  2 (two) times daily with a meal.    [provider]  metoCLOPramide (REGLAN) 10 MG tablet Take 1 tablet (10 mg total) by mouth every 6 (six) hours as needed (headache or nausea). 05/20/18   Robinson, [place] N, PA-C  metoprolol tartrate (LOPRESSOR) 25 MG tablet Take 25 mg by mouth 2 (two) times daily.    [provider]  montelukast (SINGULAIR) 10 MG tablet Take 10 mg by mouth daily.    [provider]  omeprazole (PRILOSEC) 20 MG capsule Take 1 capsule (20 mg total) by mouth daily. 08/01/19   Arby Barrette, MD  ondansetron (ZOFRAN ODT) 4 MG disintegrating tablet Take 1 tablet (4 mg total) by mouth every 4 (four) hours as needed for nausea or vomiting. 08/01/19   Arby Barrette, MD  ondansetron (ZOFRAN) 4 MG tablet Take 1 tablet (4 mg total) by mouth every 6 (six) hours. 11/20/18   Hedges, Tinnie Gens, PA-C  Vitamin D, Ergocalciferol, (DRISDOL) 1.25 MG (50000 UT) CAPS capsule Take 50,000 Units by mouth every 7 (seven) days.    [provider]    Allergies    Coconut oil, Imitrex [sumatriptan], Morphine and related, Penicillins, and Toradol [ketorolac tromethamine]  Review of Systems   Review of Systems 10 Systems reviewed and are negative for acute change except as noted in the HPI. Physical Exam Updated Vital Signs BP 111/75 (BP Location: Left Arm)   Pulse 99   Temp 98.6 F (37 C) (Oral)   Resp 17   Ht 5' 4.5" (1.638 m)   Wt 117.9 kg   SpO2 95%   BMI 43.94 kg/m   Physical Exam Constitutional:      Appearance: She is well-developed.  HENT:     Head: Normocephalic and atraumatic.  Eyes:     Extraocular Movements: Extraocular movements intact.     Conjunctiva/sclera: Conjunctivae normal.  Cardiovascular:     Rate and Rhythm: Normal rate and regular rhythm.     Heart sounds: Normal heart sounds.  Pulmonary:     Effort: Pulmonary effort is normal.     Breath sounds: Normal breath sounds.  Abdominal:     General: Bowel sounds are normal. There is no  distension.     Palpations: Abdomen is soft.     Tenderness: There is abdominal tenderness.     Comments: Mild epigastric tenderness to palpation.  Musculoskeletal:        General: Normal  range of motion.     Cervical back: Neck supple.  Skin:    General: Skin is warm and dry.  Neurological:     Mental Status: She is alert and oriented to person, place, and time.     GCS: GCS eye subscore is 4. GCS verbal subscore is 5. GCS motor subscore is 6.     Coordination: Coordination normal.     ED Results / Procedures / Treatments   Labs (all labs ordered are listed, but only abnormal results are displayed) Labs Reviewed  BASIC METABOLIC PANEL - Abnormal; Notable for the following components:      Result Value   Sodium 134 (*)    Chloride 94 (*)    Glucose, Bld 258 (*)    BUN 22 (*)    Creatinine, Ser 1.04 (*)    All other components within normal limits  CBC - Abnormal; Notable for the following components:   Platelets 421 (*)    All other components within normal limits  URINALYSIS, ROUTINE W REFLEX MICROSCOPIC - Abnormal; Notable for the following components:   Glucose, UA >=500 (*)    All other components within normal limits  CBG MONITORING, ED - Abnormal; Notable for the following components:   Glucose-Capillary 278 (*)    All other components within normal limits  CBG MONITORING, ED - Abnormal; Notable for the following components:   Glucose-Capillary 268 (*)    All other components within normal limits  HEPATIC FUNCTION PANEL  LIPASE, BLOOD  BLOOD GAS, VENOUS  CBG MONITORING, ED    EKG None  Radiology No results found.  Procedures Procedures (including critical care time)  Medications Ordered in ED Medications  sodium chloride 0.9 % bolus 1,000 mL (1,000 mLs Intravenous New Bag/Given 08/01/19 2145)    Followed by  0.9 %  sodium chloride infusion (has no administration in time range)  ondansetron [facility]) injection 4 mg (4 mg Intravenous Given 08/01/19 2145)   pantoprazole (PROTONIX) injection 40 mg (40 mg Intravenous Given 08/01/19 2146)    ED Course  I have reviewed the triage vital signs and the nursing notes.  Pertinent labs & imaging results that were available during my care of the patient were reviewed by me and considered in my medical decision making (see chart for details).    MDM Rules/Calculators/A&P                     Patient presents as outlined above.  She has had vomiting for several days.  She has been out of her insulin.  Labs do not show DKA at this time.  Patient got 20 units of insulin prior to being referred to the emergency department.  Blood sugar is now 258.  Patient is clinically well in appearance.  Will rehydrate and give Zofran and Protonix for symptoms.  Will recheck blood sugar after hydration and determine evening doses.  Will have oral challenge.  Anticipate if patient responds well to hydration oral challenge will be stable for discharge.  Patient has not had any vomiting in the emergency department.  Stable at this time for discharge.  Will take Lantus when home.  Will provide prescriptions for omeprazole and Zofran for symptom control.  Patient has already picked up her NovoLog. Final Clinical Impression(s) / ED Diagnoses Final diagnoses:  Type 2 diabetes mellitus with hyperglycemia, with long-term current use of insulin (HCC)  Non-intractable vomiting with nausea, unspecified vomiting type    Rx / DC Orders ED  Discharge Orders         Ordered    ondansetron (ZOFRAN ODT) 4 MG disintegrating tablet  Every 4 hours PRN     08/01/19 2252    omeprazole (PRILOSEC) 20 MG capsule  Daily     08/01/19 2252           Arby Barrette, MD 08/01/19 2256

## 2019-09-22 ENCOUNTER — Emergency Department (HOSPITAL_COMMUNITY)
Admission: EM | Admit: 2019-09-22 | Discharge: 2019-09-23 | Disposition: A | Discharge status: Home / Self Care | Payer: 59 | Attending: Emergency Medicine | Admitting: Emergency Medicine

## 2019-09-22 ENCOUNTER — Other Ambulatory Visit: Payer: Self-pay

## 2019-09-22 ENCOUNTER — Encounter (HOSPITAL_COMMUNITY): Payer: Self-pay

## 2019-09-22 DIAGNOSIS — Z20822 Contact with and (suspected) exposure to covid-19: Secondary | ICD-10-CM | POA: Diagnosis not present

## 2019-09-22 DIAGNOSIS — R0789 Other chest pain: Secondary | ICD-10-CM | POA: Diagnosis not present

## 2019-09-22 DIAGNOSIS — Z5321 Procedure and treatment not carried out due to patient leaving prior to being seen by health care provider: Secondary | ICD-10-CM | POA: Diagnosis not present

## 2019-09-22 LAB — BASIC METABOLIC PANEL
Anion gap: 13 (ref 5–15)
BUN: 9 mg/dL (ref 6–20)
CO2: 30 mmol/L (ref 22–32)
Calcium: 9.3 mg/dL (ref 8.9–10.3)
Chloride: 99 mmol/L (ref 98–111)
Creatinine, Ser: 0.81 mg/dL (ref 0.44–1.00)
GFR calc Af Amer: >60 mL/min (ref >60)
GFR calc non Af Amer: >60 mL/min (ref >60)
Glucose, Bld: 103 mg/dL — ABNORMAL HIGH (ref 70–99)
Potassium: 3.1 mmol/L — ABNORMAL LOW (ref 3.5–5.1)
Sodium: 142 mmol/L (ref 135–145)

## 2019-09-22 LAB — I-STAT BETA HCG BLOOD, ED (MC, WL, AP ONLY): I-stat hCG, quantitative: <5 m[IU]/mL (ref <5)

## 2019-09-22 LAB — CBC
HCT: 40.2 % (ref 36.0–46.0)
Hemoglobin: 12.5 g/dL (ref 12.0–15.0)
MCH: 27.8 pg (ref 26.0–34.0)
MCHC: 31.1 g/dL (ref 30.0–36.0)
MCV: 89.5 fL (ref 80.0–100.0)
Platelets: 375 10*3/uL (ref 150–400)
RBC: 4.49 MIL/uL (ref 3.87–5.11)
RDW: 13 % (ref 11.5–15.5)
WBC: 7.5 10*3/uL (ref 4.0–10.5)
nRBC: 0 % (ref 0.0–0.2)

## 2019-09-22 LAB — TROPONIN I (HIGH SENSITIVITY)
Troponin I (High Sensitivity): 3 ng/L (ref <18)
Troponin I (High Sensitivity): 3 ng/L (ref <18)

## 2019-09-22 LAB — SARS CORONAVIRUS 2 BY RT PCR (HOSPITAL ORDER, PERFORMED IN ~~LOC~~ HOSPITAL LAB): SARS Coronavirus 2: NEGATIVE

## 2019-09-22 MED ORDER — SODIUM CHLORIDE 0.9% FLUSH: 3.0000 mL | Freq: Once | INTRAVENOUS | Status: DC

## 2019-09-22 NOTE — ED Triage Notes (Signed)
Pt states that she has been having CP and SOB for the past 3 weeks with vomiting. Loss of taste and smell that started 2 days ago, dry cough.

## 2019-09-23 NOTE — ED Notes (Signed)
Pt stated she needed to leave cause she had to work in the morning.  She will return if needed.

## 2020-02-06 ENCOUNTER — Emergency Department (HOSPITAL_COMMUNITY)
Admission: EM | Admit: 2020-02-06 | Discharge: 2020-02-06 | Disposition: A | Discharge status: Home / Self Care | Payer: 59 | Attending: Emergency Medicine | Admitting: Emergency Medicine

## 2020-02-06 ENCOUNTER — Emergency Department (HOSPITAL_COMMUNITY): Payer: 59

## 2020-02-06 ENCOUNTER — Encounter (HOSPITAL_COMMUNITY): Payer: Self-pay | Admitting: Emergency Medicine

## 2020-02-06 DIAGNOSIS — R1110 Vomiting, unspecified: Secondary | ICD-10-CM | POA: Diagnosis present

## 2020-02-06 DIAGNOSIS — E860 Dehydration: Secondary | ICD-10-CM | POA: Insufficient documentation

## 2020-02-06 DIAGNOSIS — Z7984 Long term (current) use of oral hypoglycemic drugs: Secondary | ICD-10-CM | POA: Diagnosis not present

## 2020-02-06 DIAGNOSIS — R10817 Generalized abdominal tenderness: Secondary | ICD-10-CM | POA: Insufficient documentation

## 2020-02-06 DIAGNOSIS — Z794 Long term (current) use of insulin: Secondary | ICD-10-CM | POA: Insufficient documentation

## 2020-02-06 DIAGNOSIS — J45909 Unspecified asthma, uncomplicated: Secondary | ICD-10-CM | POA: Insufficient documentation

## 2020-02-06 DIAGNOSIS — N179 Acute kidney failure, unspecified: Secondary | ICD-10-CM | POA: Diagnosis not present

## 2020-02-06 DIAGNOSIS — E119 Type 2 diabetes mellitus without complications: Secondary | ICD-10-CM | POA: Insufficient documentation

## 2020-02-06 DIAGNOSIS — Z79899 Other long term (current) drug therapy: Secondary | ICD-10-CM | POA: Insufficient documentation

## 2020-02-06 DIAGNOSIS — I10 Essential (primary) hypertension: Secondary | ICD-10-CM | POA: Diagnosis not present

## 2020-02-06 LAB — URINALYSIS, ROUTINE W REFLEX MICROSCOPIC
Bilirubin Urine: NEGATIVE
Glucose, UA: NEGATIVE mg/dL
Hgb urine dipstick: NEGATIVE
Ketones, ur: NEGATIVE mg/dL
Nitrite: NEGATIVE
Protein, ur: NEGATIVE mg/dL
Specific Gravity, Urine: 1.023 (ref 1.005–1.030)
pH: 5 (ref 5.0–8.0)

## 2020-02-06 LAB — COMPREHENSIVE METABOLIC PANEL
ALT: 15 U/L (ref 0–44)
AST: 18 U/L (ref 15–41)
Albumin: 3.7 g/dL (ref 3.5–5.0)
Alkaline Phosphatase: 88 U/L (ref 38–126)
Anion gap: 13 (ref 5–15)
BUN: 24 mg/dL — ABNORMAL HIGH (ref 6–20)
CO2: 26 mmol/L (ref 22–32)
Calcium: 9.6 mg/dL (ref 8.9–10.3)
Chloride: 99 mmol/L (ref 98–111)
Creatinine, Ser: 1.43 mg/dL — ABNORMAL HIGH (ref 0.44–1.00)
GFR, Estimated: 46 mL/min — ABNORMAL LOW (ref >60)
Glucose, Bld: 117 mg/dL — ABNORMAL HIGH (ref 70–99)
Potassium: 3.8 mmol/L (ref 3.5–5.1)
Sodium: 138 mmol/L (ref 135–145)
Total Bilirubin: 0.8 mg/dL (ref 0.3–1.2)
Total Protein: 7.5 g/dL (ref 6.5–8.1)

## 2020-02-06 LAB — CBC
HCT: 43.5 % (ref 36.0–46.0)
Hemoglobin: 13.6 g/dL (ref 12.0–15.0)
MCH: 26.9 pg (ref 26.0–34.0)
MCHC: 31.3 g/dL (ref 30.0–36.0)
MCV: 86.1 fL (ref 80.0–100.0)
Platelets: 397 10*3/uL (ref 150–400)
RBC: 5.05 MIL/uL (ref 3.87–5.11)
RDW: 14 % (ref 11.5–15.5)
WBC: 9.4 10*3/uL (ref 4.0–10.5)
nRBC: 0 % (ref 0.0–0.2)

## 2020-02-06 LAB — I-STAT BETA HCG BLOOD, ED (MC, WL, AP ONLY): I-stat hCG, quantitative: <5 m[IU]/mL (ref <5)

## 2020-02-06 MED ORDER — SODIUM CHLORIDE 0.9 % IV BOLUS
1000.0000 mL | Freq: Once | INTRAVENOUS | Status: AC
  Administered 2020-02-06: 1000 mL via INTRAVENOUS

## 2020-02-06 MED ORDER — ONDANSETRON HCL 4 MG/2ML IJ SOLN
4.0000 mg | Freq: Once | INTRAMUSCULAR | Status: AC
  Administered 2020-02-06: 4 mg via INTRAVENOUS
  Filled 2020-02-06: qty 2

## 2020-02-06 MED ORDER — FAMOTIDINE IN NACL 20-0.9 MG/50ML-% IV SOLN
20.0000 mg | Freq: Once | INTRAVENOUS | Status: AC
  Administered 2020-02-06: 20 mg via INTRAVENOUS
  Filled 2020-02-06: qty 50

## 2020-02-06 MED ORDER — ONDANSETRON 4 MG PO TBDP
4.0000 mg | ORAL_TABLET | Freq: Three times a day (TID) | ORAL | 0 refills | Status: DC | PRN
Start: 2020-02-06 — End: 2021-01-11

## 2020-02-06 NOTE — ED Provider Notes (Signed)
[facility] Provider Note   CSN: [id number] Arrival date & time: 02/06/20  1327     History Chief Complaint  Patient presents with  . Abnormal Lab    Riesa PopeKisha Weida is a 44 y.o. female.  The history is provided by the patient and medical records.  Abnormal Lab  Riesa PopeKisha Condon is a 44 y.o. female who presents to the Emergency Department complaining of vomiting/abnormal lab.  She has been experiencing one week of vomiting, which she attributes to taking meloxicam for pain following trigger release surgery.  She has been vomiting 3-4 times a day, yellow in color.  She has associated generalized abdominal pain as well as right flank pain.  The abdominal pain she describes as soreness from vomiting.  The right flank pain is described as a dull ache.  She went to her doctor on Wednesday for a routine physical and was called today and told to go to the ED due to her kidney function being 18.  She feels dehydrated.    Denies fever, dysuria.  Has diarrhea.      Past Medical History:  Diagnosis Date  . Asthma   . Blood transfusion without reported diagnosis 03/2008   During Hysterectomy   . Carpal tunnel syndrome   . Diabetes mellitus without complication (HCC)   . Fluid collection of middle ear   . Gout   . Herniated disc, cervical   . Hypertension   . Migraine   . Neuropathy   . Sciatica     Patient Active Problem List   Diagnosis Date Noted  . Screening breast examination 01/28/2019  . Breast pain 01/28/2019    Past Surgical History:  Procedure Laterality Date  . ABDOMINAL HYSTERECTOMY    . APPENDECTOMY    . CARPAL TUNNEL RELEASE    . CHOLECYSTECTOMY    . TRIGGER FINGER RELEASE    . TUBAL LIGATION       OB History    Gravida  2   Para      Term      Preterm      AB      Living        SAB      TAB      Ectopic      Multiple      Live Births  2           Family History  Problem Relation Age of Onset  .  Hypokalemia Mother   . Gout Mother   . Breast cancer Maternal Grandmother   . Diabetes Maternal Great-grandmother     Social History   Tobacco Use  . Smoking status: Never Smoker  . Smokeless tobacco: Never Used  Vaping Use  . Vaping Use: Never used  Substance Use Topics  . Alcohol use: Yes    Comment: occassionally  . Drug use: Never    Home Medications Prior to Admission medications   Medication Sig Start Date End Date Taking? Authorizing Provider  allopurinol (ZYLOPRIM) 100 MG tablet Take 100 mg by mouth daily.    [provider]  atorvastatin (LIPITOR) 40 MG tablet Take 40 mg by mouth daily.    [provider]  cetirizine (ZYRTEC) 10 MG tablet Take 10 mg by mouth daily.    [provider]  cyclobenzaprine (FLEXERIL) 10 MG tablet Take 10 mg by mouth daily.    [provider]  diphenhydrAMINE (BENADRYL) 25 MG tablet Take 25 mg by mouth every  6 (six) hours as needed for allergies.    [provider]  famotidine (PEPCID) 40 MG tablet Take 40 mg by mouth 2 (two) times daily.    [provider]  gabapentin (NEURONTIN) 300 MG capsule Take 300 mg by mouth 4 (four) times daily.    [provider]  insulin aspart (NOVOLOG FLEXPEN) 100 UNIT/ML FlexPen Inject 2-14 Units into the skin 3 (three) times daily with meals. Sliding Scale    [provider]  insulin glargine (LANTUS) 100 UNIT/ML injection Inject 40 Units into the skin at bedtime.    [provider]  lisinopril-hydrochlorothiazide (ZESTORETIC) 20-12.5 MG tablet Take 1 tablet by mouth 2 (two) times daily.    [provider]  metFORMIN (GLUCOPHAGE) 1000 MG tablet Take 1,000 mg by mouth 2 (two) times daily with a meal.    [provider]  metoCLOPramide (REGLAN) 10 MG tablet Take 1 tablet (10 mg total) by mouth every 6 (six) hours as needed (headache or nausea). 05/20/18   Robinson, [place] N, PA-C  metoprolol tartrate (LOPRESSOR) 25 MG  tablet Take 25 mg by mouth 2 (two) times daily.    [provider]  montelukast (SINGULAIR) 10 MG tablet Take 10 mg by mouth daily.    [provider]  omeprazole (PRILOSEC) 20 MG capsule Take 1 capsule (20 mg total) by mouth daily. 08/01/19   Arby Barrette, MD  ondansetron (ZOFRAN ODT) 4 MG disintegrating tablet Take 1 tablet (4 mg total) by mouth every 8 (eight) hours as needed for nausea or vomiting. 02/06/20   Tilden Fossa, MD  ondansetron (ZOFRAN) 4 MG tablet Take 1 tablet (4 mg total) by mouth every 6 (six) hours. 11/20/18   Hedges, Tinnie Gens, PA-C  Vitamin D, Ergocalciferol, (DRISDOL) 1.25 MG (50000 UT) CAPS capsule Take 50,000 Units by mouth every 7 (seven) days.    [provider]    Allergies    Coconut oil, Imitrex [sumatriptan], Morphine and related, Penicillins, and Toradol [ketorolac tromethamine]  Review of Systems   Review of Systems  All other systems reviewed and are negative.   Physical Exam Updated Vital Signs BP 120/67   Pulse 96   Temp 98.8 F (37.1 C) (Oral)   Resp 19   Ht 5\' 4"  (1.626 m)   Wt 116.6 kg   SpO2 100%   BMI 44.11 kg/m   Physical Exam Vitals and nursing note reviewed.  Constitutional:      Appearance: She is well-developed.  HENT:     Head: Normocephalic and atraumatic.  Cardiovascular:     Rate and Rhythm: Normal rate and regular rhythm.  Pulmonary:     Effort: Pulmonary effort is normal. No respiratory distress.  Abdominal:     Palpations: Abdomen is soft.     Tenderness: There is no guarding or rebound.     Comments: Mild generalized abdominal tenderness  Musculoskeletal:        General: No tenderness.  Skin:    General: Skin is warm and dry.  Neurological:     Mental Status: She is alert and oriented to person, place, and time.  Psychiatric:        Behavior: Behavior normal.     ED Results / Procedures / Treatments   Labs (all labs ordered are listed, but only abnormal results are  displayed) Labs Reviewed  COMPREHENSIVE METABOLIC PANEL - Abnormal; Notable for the following components:      Result Value   Glucose, Bld 117 (*)  BUN 24 (*)    Creatinine, Ser 1.43 (*)    GFR, Estimated 46 (*)    All other components within normal limits  URINALYSIS, ROUTINE W REFLEX MICROSCOPIC - Abnormal; Notable for the following components:   APPearance HAZY (*)    Leukocytes,Ua TRACE (*)    Bacteria, UA RARE (*)    All other components within normal limits  CBC  I-STAT BETA HCG BLOOD, ED ([facility], [facility], [facility] ONLY)    EKG None  Radiology CT Renal Stone Study  Result Date: 02/06/2020 CLINICAL DATA:  44 year old female with flank pain. Concern for kidney stone. EXAM: CT ABDOMEN AND PELVIS WITHOUT CONTRAST TECHNIQUE: Multidetector CT imaging of the abdomen and pelvis was performed following the standard protocol without IV contrast. COMPARISON:  None. FINDINGS: Evaluation of this exam is limited in the absence of intravenous contrast. Lower chest: There are bibasilar linear atelectasis/scarring. The visualized lung bases are otherwise clear. No intra-abdominal free air or free fluid. Hepatobiliary: Faint 15 mm hypodense lesion in the dome of the liver is not characterized on this noncontrast CT. The liver is otherwise unremarkable. No intrahepatic biliary dilatation. Cholecystectomy. No retained calcified stone noted in the central CBD. Pancreas: Unremarkable. No pancreatic ductal dilatation or surrounding inflammatory changes. Spleen: Normal in size without focal abnormality. Adrenals/Urinary Tract: The adrenal glands unremarkable. There is a 7 mm nonobstructing left renal inferior pole calculus. No hydronephrosis. The right kidney is unremarkable. The visualized ureters appear unremarkable. The urinary bladder is collapsed. Stomach/Bowel: There is postsurgical changes of bowel with anastomotic sutures. There is no bowel obstruction or active inflammation. Appendectomy. Vascular/Lymphatic: The  abdominal aorta and IVC unremarkable. No portal venous gas. There is no adenopathy. Reproductive: Hysterectomy. Other: Midline vertical anterior pelvic wall incisional scar. Musculoskeletal: Degenerative changes of the spine. No acute osseous pathology. IMPRESSION: 1. A 7 mm nonobstructing left renal inferior pole calculus. No hydronephrosis. 2. Postsurgical changes of bowel. No bowel obstruction. Electronically Signed   By: Elgie Collard M.D.   On: 02/06/2020 21:57    Procedures Procedures (including critical care time)  Medications Ordered in ED Medications  sodium chloride 0.9 % bolus 1,000 mL (1,000 mLs Intravenous New Bag/Given 02/06/20 2238)  ondansetron (ZOFRAN) injection 4 mg (4 mg Intravenous Given 02/06/20 2204)  famotidine (PEPCID) IVPB 20 mg premix (0 mg Intravenous Stopped 02/06/20 2238)    ED Course  I have reviewed the triage vital signs and the nursing notes.  Pertinent labs & imaging results that were available during my care of the patient were reviewed by me and considered in my medical decision making (see chart for details).    MDM Rules/Calculators/A&P                         patient referred to the emergency department for abnormal kidney function on outpatient labs in setting of one week of vomiting. Outpatient labs are not available on her ED evaluation. Labs today in the emergency department are consistent with acute kidney injury and mild dehydration. UA is not consistent with UTI. Given her reports of flank pain a CT stone study was obtained, which is negative for obstructing stone or source of her vomiting. She is able to tolerate oral's in the department. She was treated with IV fluid hydration. Plan to discharge home with PCP follow-up and return precautions. Recommend avoiding NSAIDs.  Final Clinical Impression(s) / ED Diagnoses Final diagnoses:  AKI (acute kidney injury) (HCC)  Dehydration    Rx /  DC Orders ED Discharge Orders         Ordered     ondansetron (ZOFRAN ODT) 4 MG disintegrating tablet  Every 8 hours PRN        02/06/20 2221           Tilden Fossa, MD 02/06/20 2326

## 2020-02-06 NOTE — ED Triage Notes (Signed)
Pt states she had been taking meloxicam and then was taking other NSAIDS as well, reports she was unaware these could be dangerous to her kidneys. States she has been vomiting, saw pcp yesterday for routine labs and was told that her kidney function was abnormal. pcp was unsure if this is due to NSAIDS or dehydration.

## 2020-02-06 NOTE — Discharge Instructions (Signed)
Your creatinine (part of your kidney function) was mildly elevated today.  This can be from medications and dehydration. Avoid ibuprofen, meloxicam. Drink plenty of fluids. Please follow-up with your family doctor in the next week for recheck.  You have a kidney stone in your left kidney, the does not appear to be causing you any problems. Please follow-up with your family doctor regarding this.

## 2020-02-06 NOTE — ED Notes (Signed)
Patient transported to CT 

## 2020-12-23 ENCOUNTER — Encounter: Payer: Self-pay | Admitting: *Deleted

## 2020-12-28 ENCOUNTER — Ambulatory Visit: Payer: 59 | Admitting: Psychiatry

## 2021-01-09 ENCOUNTER — Emergency Department (HOSPITAL_COMMUNITY): Payer: 59

## 2021-01-09 ENCOUNTER — Emergency Department (HOSPITAL_COMMUNITY)
Admission: EM | Admit: 2021-01-09 | Discharge: 2021-01-10 | Disposition: A | Discharge status: Home / Self Care | Payer: 59 | Attending: Emergency Medicine | Admitting: Emergency Medicine

## 2021-01-09 ENCOUNTER — Other Ambulatory Visit: Payer: Self-pay

## 2021-01-09 ENCOUNTER — Encounter (HOSPITAL_COMMUNITY): Payer: Self-pay | Admitting: Emergency Medicine

## 2021-01-09 DIAGNOSIS — Z79899 Other long term (current) drug therapy: Secondary | ICD-10-CM | POA: Diagnosis not present

## 2021-01-09 DIAGNOSIS — E1140 Type 2 diabetes mellitus with diabetic neuropathy, unspecified: Secondary | ICD-10-CM | POA: Diagnosis not present

## 2021-01-09 DIAGNOSIS — Z7984 Long term (current) use of oral hypoglycemic drugs: Secondary | ICD-10-CM | POA: Insufficient documentation

## 2021-01-09 DIAGNOSIS — I10 Essential (primary) hypertension: Secondary | ICD-10-CM | POA: Diagnosis not present

## 2021-01-09 DIAGNOSIS — M25532 Pain in left wrist: Secondary | ICD-10-CM | POA: Diagnosis not present

## 2021-01-09 DIAGNOSIS — Y9301 Activity, walking, marching and hiking: Secondary | ICD-10-CM | POA: Diagnosis not present

## 2021-01-09 DIAGNOSIS — W19XXXA Unspecified fall, initial encounter: Secondary | ICD-10-CM

## 2021-01-09 DIAGNOSIS — S299XXA Unspecified injury of thorax, initial encounter: Secondary | ICD-10-CM | POA: Diagnosis present

## 2021-01-09 DIAGNOSIS — J45909 Unspecified asthma, uncomplicated: Secondary | ICD-10-CM | POA: Insufficient documentation

## 2021-01-09 DIAGNOSIS — S20212A Contusion of left front wall of thorax, initial encounter: Secondary | ICD-10-CM | POA: Diagnosis not present

## 2021-01-09 DIAGNOSIS — Z794 Long term (current) use of insulin: Secondary | ICD-10-CM | POA: Insufficient documentation

## 2021-01-09 DIAGNOSIS — W010XXA Fall on same level from slipping, tripping and stumbling without subsequent striking against object, initial encounter: Secondary | ICD-10-CM | POA: Diagnosis not present

## 2021-01-09 NOTE — ED Triage Notes (Signed)
Pt states she got pulled down earlier today while walking a dog.  C/o pain to L wrist and L ribs.  Reports mid chest pain that started while in triage room.

## 2021-01-09 NOTE — ED Provider Notes (Signed)
Emergency Medicine Provider Triage Evaluation Note  Lisa Kennedy , a 45 y.o. female  was evaluated in triage.  Pt complains of mechanical fall that occurred earlier today while walking the dog; tripped and landed onto her left outstretched wrist. Complaining of pain to wrist and l ribs. No head injury or LOC. No other complaints. Hx hysterectomy.   Review of Systems  Positive: + left wrist pain, left rib pain Negative: - head injury, syncope  Physical Exam  There were no vitals taken for this visit. Gen:   Awake, no distress   Resp:  Normal effort  MSK:   Moves extremities without difficulty  Other:  + mild swelling and ecchymosis to L wrist with TTP. Limited ROM s/2 pain. 2+ radial pulse. + left rib TTP  Medical Decision Making  Medically screening exam initiated at 5:35 PM.  Appropriate orders placed.  Lisa Kennedy was informed that the remainder of the evaluation will be completed by another provider, this initial triage assessment does not replace that evaluation, and the importance of remaining in the ED until their evaluation is complete.     Tanda Rockers, PA-C 01/09/21 1737    Benjiman Core, MD 01/09/21 2119

## 2021-01-10 MED ORDER — METHOCARBAMOL 500 MG PO TABS
500.0000 mg | ORAL_TABLET | Freq: Two times a day (BID) | ORAL | 0 refills | Status: DC
Start: 2021-01-10 — End: 2021-12-27

## 2021-01-10 MED ORDER — ACETAMINOPHEN 500 MG PO TABS
1000.0000 mg | ORAL_TABLET | Freq: Once | ORAL | Status: AC
  Administered 2021-01-10: 1000 mg via ORAL
  Filled 2021-01-10: qty 2

## 2021-01-10 NOTE — ED Provider Notes (Signed)
[facility] EMERGENCY DEPARTMENT Provider Note   CSN: [id number] Arrival date & time: 01/09/21  1658     History Chief Complaint  Patient presents with   Marletta Lor    Lisa Kennedy is a 44 y.o. female.  HPI Patient is a 45 year old female with a past medical history detailed below presented to the ER today with complaint of fall.  She states that she was walking the dog earlier today and tripped/was pulled slightly by her dog landed on her outstretched left wrist which buckled quickly and she fell onto her left side.  She states that she has some pain in her left rib cage denies any head injury loss of consciousness nausea or vomiting.  No other associate symptoms.  No other complaints.  Not on blood thinners.  Denies any numbness or weakness slurred speech confusion denies any chest pain shortness of breath lightheadedness or dizziness.  States that this fall was due to tripping     Past Medical History:  Diagnosis Date   Asthma    Blood transfusion without reported diagnosis 03/2008   During Hysterectomy    Carpal tunnel syndrome    Diabetes mellitus without complication (HCC)    type 2   Fluid collection of middle ear    Gout    Herniated disc, cervical    History of vitamin D deficiency    Hyperlipidemia    Hypertension    Migraine    Neuropathy    Sciatica     Patient Active Problem List   Diagnosis Date Noted   Screening breast examination 01/28/2019   Breast pain 01/28/2019    Past Surgical History:  Procedure Laterality Date   ABDOMINAL HYSTERECTOMY  2010   APPENDECTOMY  2010   CARPAL TUNNEL RELEASE  2008   CHOLECYSTECTOMY  1995   TRIGGER FINGER RELEASE     TUBAL LIGATION  1997     OB History     Gravida  2   Para      Term      Preterm      AB      Living         SAB      IAB      Ectopic      Multiple      Live Births  2           Family History  Problem Relation Age of Onset   Hypertension Mother     Hypokalemia Mother    Gout Mother    Kidney disease Mother    Asthma Sister    Asthma Brother    Stroke Maternal Grandmother    Breast cancer Maternal Grandmother    Colon cancer Maternal Grandfather    Diabetes Maternal Great-grandmother     Social History   Tobacco Use   Smoking status: Never   Smokeless tobacco: Never  Vaping Use   Vaping Use: Never used  Substance Use Topics   Alcohol use: Yes    Comment: occassionally   Drug use: Never    Home Medications Prior to Admission medications   Medication Sig Start Date End Date Taking? Authorizing Provider  methocarbamol (ROBAXIN) 500 MG tablet Take 1 tablet (500 mg total) by mouth 2 (two) times daily. 01/10/21  Yes Naira Standiford S, PA  allopurinol (ZYLOPRIM) 100 MG tablet Take 100 mg by mouth daily.    [provider]  atorvastatin (LIPITOR) 40 MG tablet Take 40 mg by mouth daily.  [provider]  cetirizine (ZYRTEC) 10 MG tablet Take 10 mg by mouth daily.    [provider]  diphenhydrAMINE (BENADRYL) 25 MG tablet Take 25 mg by mouth every 6 (six) hours as needed for allergies.    [provider]  famotidine (PEPCID) 40 MG tablet Take 40 mg by mouth 2 (two) times daily.    [provider]  gabapentin (NEURONTIN) 300 MG capsule Take 300 mg by mouth 4 (four) times daily.    [provider]  insulin aspart (NOVOLOG FLEXPEN) 100 UNIT/ML FlexPen Inject 2-14 Units into the skin 3 (three) times daily with meals. Sliding Scale    [provider]  insulin glargine (LANTUS) 100 UNIT/ML injection Inject 40 Units into the skin at bedtime.    [provider]  lisinopril-hydrochlorothiazide (ZESTORETIC) 20-12.5 MG tablet Take 1 tablet by mouth 2 (two) times daily.    [provider]  metFORMIN (GLUCOPHAGE) 1000 MG tablet Take 1,000 mg by mouth 2 (two) times daily with a meal.    [provider]  metoCLOPramide (REGLAN) 10 MG tablet Take 1 tablet (10  mg total) by mouth every 6 (six) hours as needed (headache or nausea). 05/20/18   Robinson, [place] N, PA-C  metoprolol tartrate (LOPRESSOR) 25 MG tablet Take 25 mg by mouth 2 (two) times daily.    [provider]  montelukast (SINGULAIR) 10 MG tablet Take 10 mg by mouth daily.    [provider]  omeprazole (PRILOSEC) 20 MG capsule Take 1 capsule (20 mg total) by mouth daily. 08/01/19   Arby Barrette, MD  ondansetron (ZOFRAN ODT) 4 MG disintegrating tablet Take 1 tablet (4 mg total) by mouth every 8 (eight) hours as needed for nausea or vomiting. 02/06/20   Tilden Fossa, MD  ondansetron (ZOFRAN) 4 MG tablet Take 1 tablet (4 mg total) by mouth every 6 (six) hours. 11/20/18   Hedges, Tinnie Gens, PA-C  Vitamin D, Ergocalciferol, (DRISDOL) 1.25 MG (50000 UT) CAPS capsule Take 50,000 Units by mouth every 7 (seven) days.    [provider]    Allergies    Coconut oil, Imitrex [sumatriptan], Morphine and related, Penicillins, Toradol [ketorolac tromethamine], Ibuprofen, and Meloxicam  Review of Systems   Review of Systems  Constitutional:  Negative for chills and fever.  HENT:  Negative for congestion.   Eyes:  Negative for pain.  Respiratory:  Negative for cough and shortness of breath.   Cardiovascular:  Negative for chest pain and leg swelling.       Rib pain L side  Gastrointestinal:  Negative for abdominal pain and vomiting.  Genitourinary:  Negative for dysuria.  Musculoskeletal:  Negative for myalgias.       Left wrist pain  Skin:  Negative for rash.  Neurological:  Negative for dizziness and headaches.   Physical Exam Updated Vital Signs BP 122/82 (BP Location: Right Arm)   Pulse 83   Temp 98 F (36.7 C)   Resp 13   Ht 5\' 4"  (1.626 m)   Wt 118.8 kg   SpO2 98%   BMI 44.97 kg/m   Physical Exam Vitals and nursing note reviewed.  Constitutional:      General: She is not in acute distress.    Appearance: Normal appearance. She is not ill-appearing.   HENT:     Head: Normocephalic and atraumatic.     Nose: Nose normal.  Eyes:     General: No scleral icterus.       Right eye:  No discharge.        Left eye: No discharge.     Conjunctiva/sclera: Conjunctivae normal.  Cardiovascular:     Rate and Rhythm: Normal rate and regular rhythm.     Pulses: Normal pulses.     Heart sounds: Normal heart sounds.  Pulmonary:     Effort: Pulmonary effort is normal. No respiratory distress.     Breath sounds: No stridor. No wheezing.  Abdominal:     Palpations: Abdomen is soft.     Tenderness: There is no abdominal tenderness.  Musculoskeletal:     Cervical back: Normal range of motion.     Right lower leg: No edema.     Left lower leg: No edema.     Comments: No bony tenderness over joints or long bones of the upper and lower extremities.   Apart from some tenderness of the left anatomical snuffbox.  No neck or back midline tenderness, step-off, deformity, or bruising. Able to turn head left and right 45 degrees without difficulty.  Full range of motion of upper and lower extremity joints shown after palpation was conducted; with 5/5 symmetrical strength in upper and lower extremities. No chest wall tenderness, no facial or cranial tenderness.   Patient has intact sensation grossly in lower and upper extremities. Intact patellar and ankle reflexes. Patient able to ambulate without difficulty.  Radial and DP pulses palpated BL.   Skin:    General: Skin is warm and dry.     Capillary Refill: Capillary refill takes less than 2 seconds.  Neurological:     Mental Status: She is alert and oriented to person, place, and time. Mental status is at baseline.  Psychiatric:        Mood and Affect: Mood normal.        Behavior: Behavior normal.    ED Results / Procedures / Treatments   Labs (all labs ordered are listed, but only abnormal results are displayed) Labs Reviewed - No data to display  EKG EKG Interpretation  Date/Time:  Sunday  January 09 2021 17:54:52 EDT Ventricular Rate:  101 PR Interval:  158 QRS Duration: 80 QT Interval:  336 QTC Calculation: 435 R Axis:   -17 Text Interpretation: Sinus tachycardia Otherwise normal ECG No acute changes Confirmed by Drema Pry [phone]) on 01/09/2021 11:03:01 PM  Radiology DG Ribs Unilateral W/Chest Left  Result Date: 01/09/2021 CLINICAL DATA:  Fall, left rib pain EXAM: LEFT RIBS AND CHEST - 3+ VIEW COMPARISON:  None. FINDINGS: No fracture or other bone lesions are seen involving the ribs. There is no evidence of pneumothorax or pleural effusion. Both lungs are clear. Heart size and mediastinal contours are within normal limits. IMPRESSION: Negative. Electronically Signed   By: Charlett Nose M.D.   On: 01/09/2021 18:52   DG Wrist Complete Left  Result Date: 01/09/2021 CLINICAL DATA:  Fall, left wrist pain EXAM: LEFT WRIST - COMPLETE 3+ VIEW COMPARISON:  None. FINDINGS: There is no evidence of fracture or dislocation. There is no evidence of arthropathy or other focal bone abnormality. Soft tissues are unremarkable. IMPRESSION: Negative. Electronically Signed   By: Charlett Nose M.D.   On: 01/09/2021 18:51    Procedures Procedures   Medications Ordered in ED Medications  acetaminophen (TYLENOL) tablet 1,000 mg (1,000 mg Oral Given 01/10/21 8756)    ED Course  I have reviewed the triage vital signs and the nursing notes.  Pertinent labs & imaging results that were available during my care of the patient  were reviewed by me and considered in my medical decision making (see chart for details).    MDM Rules/Calculators/A&P                          Patient here after mechanical fall that occurred yesterday.  Has been observed here in the emergency room for over 15 hours.  Did not strike her head or lose consciousness the fall occurred because she tripped while she was walking her dog.  She complains of pain in her left wrist and left rib cage she states she fell onto  her left wrist.  Does have some snuffbox tenderness.  X-ray of left wrist and chest with left ribs reviewed personally by myself.  Agree with radiology read.  No acute fractures  Patient provided incentive spirometer suspect rib contusion versus occult rib fracture.  She does not appear to have any flail chest or evidence of multiple rib fractures.  Also placed in wrist thumb spica due to tenderness of the left anatomical snuffbox.  Patient overall well-appearing.  Placed in wrist splint will need to have scaphoid reimaged she will follow-up with [facility].  Tylenol ibuprofen and Robaxin  Return precautions given.  Patient ambulatory at time of discharge  Final Clinical Impression(s) / ED Diagnoses Final diagnoses:  Fall, initial encounter  Contusion of rib on left side, initial encounter  Left wrist pain    Rx / DC Orders ED Discharge Orders          Ordered    methocarbamol (ROBAXIN) 500 MG tablet  2 times daily        01/10/21 0826             Gailen Shelter, PA 01/10/21 4268    Wynetta Fines, MD 01/11/21 [phone]

## 2021-01-10 NOTE — ED Notes (Signed)
Incentive spirometer provided. Pt verbalized understanding how to use Wrist brace placed. Pt verbalized comfort
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
## 2021-01-10 NOTE — Discharge Instructions (Addendum)
Tylenol 1000mg  ever 6 hours as needed  Robaxin muscle relaxer for breakthrough pain  Please follow-up with [facility].  You will need to have your wrist re-xrayed at some point. Generally this is done at the 10-day mark from your injury.

## 2021-01-11 ENCOUNTER — Ambulatory Visit (INDEPENDENT_AMBULATORY_CARE_PROVIDER_SITE_OTHER): Payer: 59 | Admitting: Psychiatry

## 2021-01-11 ENCOUNTER — Encounter: Payer: Self-pay | Admitting: Psychiatry

## 2021-01-11 ENCOUNTER — Telehealth: Payer: Self-pay | Admitting: *Deleted

## 2021-01-11 VITALS — BP 116/74 | HR 81 | Ht 64.5 in | Wt 266.5 lb

## 2021-01-11 DIAGNOSIS — G4733 Obstructive sleep apnea (adult) (pediatric): Secondary | ICD-10-CM

## 2021-01-11 DIAGNOSIS — G43109 Migraine with aura, not intractable, without status migrainosus: Secondary | ICD-10-CM | POA: Diagnosis not present

## 2021-01-11 MED ORDER — NURTEC 75 MG PO TBDP: 75.0000 mg | ORAL_TABLET | ORAL | 0 refills | Status: DC | PRN

## 2021-01-11 MED ORDER — NURTEC 75 MG PO TBDP: 75.0000 mg | ORAL_TABLET | ORAL | 2 refills | Status: DC | PRN

## 2021-01-11 NOTE — Telephone Encounter (Signed)
Per CMM documents are attached. PA sent to plan. [employer] is reviewing your PA request. Typically an electronic response will be received within 24-72 hours.

## 2021-01-11 NOTE — Patient Instructions (Addendum)
Start Nurtec as needed for migraines. Take one pill at the onset of migraine. May take one pill after 24 hours if headache persists. Do not take more than one pill in 24 hours. Sleep study to look for sleep apnea   GENERAL HEADACHE INSTRUCTIONS Headache Preventive Treatment: Please keep in mind that it takes 4-6 weeks for the medication to start working well and 2-3 months at the appropriate dose before deciding if it will be useful or not. If it is not helping at all by this time, then we will discuss other medications to try. Supplements may take 3-6 months until you see full effect.   Natural supplements: Magnesium Oxide or Magnesium Glycinate 500 mg at bed (up to 800 mg daily) Coenzyme Q10 300 mg in AM Vitamin B2- 200 mg twice a day  Add 1 supplement at a time since even natural supplements can have undesirable side effects. You can sometimes buy supplements cheaper (especially Coenzyme Q10) at www.WebmailGuide.co.za or at [employer].  Vitamins and herbs that show potential:  Magnesium: Magnesium (250 mg twice a day or 500 mg at bed) has a relaxant effect on smooth muscles such as blood vessels. Individuals suffering from frequent or daily headache usually have low magnesium levels which can be increase with daily supplementation of 400-750 mg. Three trials found 40-90% average headache reduction  when used as a preventative. Magnesium also demonstrated the benefit in menstrually related migraine.  Magnesium is part of the messenger system in the serotonin cascade and it is a good muscle relaxant.  It is also useful for constipation which can be a side effect of other medications used to treat migraine. Good sources include nuts, whole grains, and tomatoes. Side Effects: loose stool/diarrhea Riboflavin (vitamin B 2) 200 mg twice a day. This vitamin assists nerve cells in the production of ATP a principal energy storing molecule.  It is necessary for many chemical reactions in the body.  There have been at  least 3 clinical trials of riboflavin using 400 mg per day all of which suggested that migraine frequency can be decreased.  All 3 trials showed significant improvement in over half of migraine sufferers.  The supplement is found in bread, cereal, milk, meat, and poultry.  Most Americans get more riboflavin than the recommended daily allowance, however riboflavin deficiency is not necessary for the supplements to help prevent headache. Side effects: energizing, green urine  Coenzyme Q10: This is present in almost all cells in the body and is critical component for the conversion of energy.  Recent studies have shown that a nutritional supplement of CoQ10 can reduce the frequency of migraine attacks by improving the energy production of cells as with riboflavin.  Doses of 150 mg twice a day have been shown to be effective.  Melatonin: Increasing evidence shows correlation between melatonin secretion and headache conditions.  Melatonin supplementation has decreased headache intensity and duration.  It is widely used as a sleep aid.  Sleep is natures way of dealing with migraine.  A dose of 3 mg is recommended to start for headaches including cluster headache. Higher doses up to 15 mg has been reviewed for use in Cluster headache and have been used. The rationale behind using melatonin for cluster is that many theories regarding the cause of Cluster headache center around the disruption of the normal circadian rhythm in the brain.  This helps restore the normal circadian rhythm.  Ginger: Ginger has a small amount of antihistamine and anti-inflammatory action which may help  headache.  It is primarily used for nausea and may aid in the absorption of other medications. HEADACHE DIET: Foods and beverages which may trigger migraine Note that only 20% of headache patients are food sensitive. You will know if you are food sensitive if you get a headache consistently 20 minutes to 2 hours after eating a certain food.  Only cut out a food if it causes headaches, otherwise you might remove foods you enjoy! What matters most for diet is to eat a well balanced healthy diet full of vegetables and low fat protein, and to not miss meals.  Chocolate, other sweets ALL cheeses except cottage and cream cheese Dairy products, yogurt, sour cream, ice cream Liver Meat extracts (Bovril, Marmite, meat tenderizers) Meats or fish which have undergone aging, fermenting, pickling or smoking. These include: Hotdogs,salami,Lox,sausage, mortadellas,smoked salmon, pepperoni, Pickled herring Pods of broad bean (English beans, Chinese pea pods, [place] (fava) beans, lima and navy beans Ripe avocado, ripe banana Yeast extracts or active yeast preparations such as Brewer's or Fleishman's (commercial bakes goods are permitted) Tomato based foods, pizza (lasagna, etc.)  MSG (monosodium glutamate) is disguised as many things; look for these common aliases: Monopotassium glutamate Autolysed yeast Hydrolysed protein Sodium caseinate "flavorings" "all natural preservatives" Nutrasweet  Avoid all other foods that convincingly provoke headaches.  Resources: The Dizzy [place] Laundry Your Headache Diet, migrainestrong.com  https://zamora-andrews.com/  Caffeine and Migraine For patients that have migraine, caffeine intake more than 3 days per week can lead to dependency and increased migraine frequency. I would recommend cutting back on your caffeine intake as best you can. The recommended amount of caffeine is 200-300 mg daily, although migraine patients may experience dependency at even lower doses. While you may notice an increase in headache temporarily, cutting back will be helpful for headaches in the long run. For more information on caffeine and migraine, visit: https://americanmigrainefoundation.org/resource-library/caffeine-and-migraine/  Headache Prevention Strategies:  1. Maintain a  headache diary; learn to identify and avoid triggers.  - This can be a simple note where you log when you had a headache, associated symptoms, and medications used - There are several smartphone apps developed to help track migraines: Migraine Buddy, Migraine Monitor, Curelator N1-Headache App  Common triggers include: Emotional triggers: Emotional/Upset family or friends Emotional/Upset occupation Business reversal/success Anticipation anxiety Crisis-serious Post-crisis periodNew job/position   Physical triggers: Vacation Day Weekend Strenuous Exercise High Altitude Location New Move Menstrual Day Physical Illness Oversleep/Not enough sleep Weather changes Light: Photophobia or light sesnitivity treatment involves a balance between desensitization and reduction in overly strong input. Use dark polarized glasses outside, but not inside. Avoid bright or fluorescent light, but do not dim environment to the point that going into a normally lit room hurts. Consider FL-41 tint lenses, which reduce the most irritating wavelengths without blocking too much light.  These can be obtained at axonoptics.com or theraspecs.com Foods: see list above.  2. Limit use of acute treatments (over-the-counter medications, triptans, etc.) to no more than 2 days per week or 10 days per month to prevent medication overuse headache (rebound headache).    3. Follow a regular schedule (including weekends and holidays): Don't skip meals. Eat a balanced diet. 8 hours of sleep nightly. Minimize stress. Exercise 30 minutes per day. Being overweight is associated with a 5 times increased risk of chronic migraine. Keep well hydrated and drink 6-8 glasses of water per day.  4. Initiate non-pharmacologic measures at the earliest onset of your headache. Rest and quiet environment. Relax and reduce  stress. Breathe2Relax is a free app that can instruct you on    some simple relaxtion and breathing techniques.  Http://Dawnbuse.com is a    free website that provides teaching videos on relaxation.  Also, there are  many apps that   can be downloaded for "mindful" relaxation.  An app called YOGA NIDRA will help walk you through mindfulness. Another app called Calm can be downloaded to give you a structured mindfulness guide with daily reminders and skill development. Headspace for guided meditation Mindfulness Based Stress Reduction Online Course: www.palousemindfulness.com Cold compresses.  5. Don't wait!! Take the maximum allowable dosage of prescribed medication at the first sign of migraine.  6. Compliance:  Take prescribed medication regularly as directed and at the first sign of a migraine.  7. Communicate:  Call your physician when problems arise, especially if your headaches change, increase in frequency/severity, or become associated with neurological symptoms (weakness, numbness, slurred speech, etc.).  8. Headache/pain management therapies: Consider various complementary methods, including medication, behavioral therapy, psychological counselling, biofeedback, massage therapy, acupuncture, dry needling, and other modalities.  Such measures may reduce the need for medications. Counseling for pain management, where patients learn to function and ignore/minimize their pain, seems to work very well.  9. Recommend changing family's attention and focus away from patient's headaches. Instead, emphasize daily activities. If first question of day is 'How are your headaches/Do you have a headache today?', then patient will constantly think about headaches, thus making them worse. Goal is to re-direct attention away from headaches, toward daily activities and other distractions.  10. Helpful Websites: www.AmericanHeadacheSociety.org PatentHood.ch www.headaches.org TightMarket.nl www.achenet.org

## 2021-01-11 NOTE — Telephone Encounter (Signed)
Nurtec PA, [id number], key: [id number]. Faxed office notes to be attached.

## 2021-01-12 NOTE — Telephone Encounter (Signed)
Received fax from [facility]: PA not needed if 16 tabs for 30 days. Plan allows for 16 tablets for one month. I faxed this information to pharmacy.

## 2021-01-12 NOTE — Addendum Note (Signed)
Addended by: Ann Maki on: 01/12/2021 03:48 PM   Modules accepted: Orders

## 2021-01-17 NOTE — Telephone Encounter (Signed)
Received phone call from Tonya from [facility] sating PA has been approved on nurtect 16/30 day supply on 01/12/2021.  Pa approved from 01/12/2021-01/11/2022, PA case ID [id number].  She also reports paid claim on 01/12/2021 was completed through the pharmacy.

## 2021-02-28 ENCOUNTER — Other Ambulatory Visit: Payer: Self-pay | Admitting: Nurse Practitioner

## 2021-02-28 DIAGNOSIS — Z87442 Personal history of urinary calculi: Secondary | ICD-10-CM

## 2021-03-01 ENCOUNTER — Other Ambulatory Visit: Payer: Self-pay | Admitting: Nurse Practitioner

## 2021-03-01 DIAGNOSIS — Z1231 Encounter for screening mammogram for malignant neoplasm of breast: Secondary | ICD-10-CM

## 2021-03-31 ENCOUNTER — Institutional Professional Consult (permissible substitution): Payer: 59 | Admitting: Neurology

## 2021-03-31 ENCOUNTER — Other Ambulatory Visit: Payer: 59

## 2021-03-31 ENCOUNTER — Ambulatory Visit: Payer: 59

## 2021-04-25 ENCOUNTER — Ambulatory Visit: Payer: 59 | Admitting: Psychiatry

## 2021-04-29 ENCOUNTER — Ambulatory Visit
Admission: RE | Admit: 2021-04-29 | Discharge: 2021-04-29 | Disposition: A | Discharge status: Home / Self Care | Payer: 59 | Source: Ambulatory Visit | Attending: Nurse Practitioner | Admitting: Nurse Practitioner

## 2021-04-29 ENCOUNTER — Other Ambulatory Visit: Payer: Self-pay

## 2021-04-29 DIAGNOSIS — Z1231 Encounter for screening mammogram for malignant neoplasm of breast: Secondary | ICD-10-CM

## 2021-04-29 DIAGNOSIS — Z87442 Personal history of urinary calculi: Secondary | ICD-10-CM

## 2021-05-30 ENCOUNTER — Ambulatory Visit (INDEPENDENT_AMBULATORY_CARE_PROVIDER_SITE_OTHER): Payer: 59 | Admitting: Neurology

## 2021-05-30 ENCOUNTER — Encounter: Payer: Self-pay | Admitting: Neurology

## 2021-05-30 ENCOUNTER — Other Ambulatory Visit: Payer: Self-pay

## 2021-05-30 VITALS — BP 123/80 | HR 95 | Ht 64.0 in | Wt 269.0 lb

## 2021-05-30 DIAGNOSIS — R0683 Snoring: Secondary | ICD-10-CM

## 2021-05-30 DIAGNOSIS — G4719 Other hypersomnia: Secondary | ICD-10-CM

## 2021-05-30 DIAGNOSIS — Z6841 Body Mass Index (BMI) 40.0 and over, adult: Secondary | ICD-10-CM

## 2021-05-30 DIAGNOSIS — R519 Headache, unspecified: Secondary | ICD-10-CM | POA: Diagnosis not present

## 2021-05-30 DIAGNOSIS — R351 Nocturia: Secondary | ICD-10-CM

## 2021-05-30 NOTE — Progress Notes (Signed)
Subjective:    Patient ID: Lisa Kennedy is a 46 y.o. female.  HPI    Star Age, MD, PhD [facility] [address] [place], [place] [address]  Dear Anderson Malta,   I saw your patient, Lisa Kennedy, upon your kind request in the sleep clinic today for initial consultation of her sleep in particular, concern for underlying obstructive sleep apnea.  The patient is unaccompanied today.  As you know, Lisa Kennedy is a 46 year old right-handed woman with history of hypertension, hyperlipidemia, migraine headaches, sciatica, cervical disc disease.  Asthma, carpal tunnel syndrome, diabetes, and severe obesity with a BMI of over 45, who reports snoring and excessive daytime somnolence, not sleeping through the night and waking up with a headache.  I reviewed your office note from 01/11/2021.  Her Epworth sleepiness score is 4/24, fatigue severity score is 35 out of 63.  Around 10 but is typically not asleep until midnight or after midnight.  Her rise time is around 8 AM.  She works from home.  She has nocturia about 2-3 times per average night.  She has no family history of sleep apnea other than sleep apnea diagnosed in her youngest child when she was little and she had a tonsillectomy and adenoidectomy at the time.  Patient's weight has been fluctuating but she is working on weight loss.  She likes to read at bedtime.  She has a history of palpitations and tachycardia.  She had seen a cardiologist in the past and had a heart monitor as well.  She has never been diagnosed with A-fib.  Her Past Medical History Is Significant For: Past Medical History:  Diagnosis Date   Asthma    Blood transfusion without reported diagnosis 03/2008   During Hysterectomy    Carpal tunnel syndrome    Diabetes mellitus without complication ([place])    type 2   Fluid collection of middle ear    Gout    Herniated disc, cervical    Lumbar as well   History of vitamin D deficiency     Hyperlipidemia    Hypertension    Irregular heartbeat    Migraine    Neuropathy    Sciatica     Her Past Surgical History Is Significant For: Past Surgical History:  Procedure Laterality Date   ABDOMINAL HYSTERECTOMY  2010   APPENDECTOMY  2010   CARPAL TUNNEL RELEASE  2008   CHOLECYSTECTOMY  1995   TRIGGER FINGER RELEASE     x 4   TUBAL LIGATION  1997    Her Family History Is Significant For: Family History  Problem Relation Age of Onset   Hypertension Mother    Hypokalemia Mother    Gout Mother    Kidney disease Mother    Heart murmur Mother    Mental illness Father    Asthma Sister    Asthma Brother    Stroke Maternal Grandmother    Breast cancer Maternal Grandmother    Colon cancer Maternal Grandfather    Diabetes Maternal Great-grandmother    Sleep apnea Daughter     Her Social History Is Significant For: Social History   Socioeconomic History   Marital status: Legally Separated    Spouse name: Not on file   Number of children: 2   Years of education: Not on file   Highest education level: Associate degree: academic program  Occupational History   Not on file  Tobacco Use   Smoking status: Never  Smokeless tobacco: Never  Vaping Use   Vaping Use: Never used  Substance and Sexual Activity   Alcohol use: Yes    Comment: occassionally   Drug use: Never   Sexual activity: Not Currently    Birth control/protection: Surgical  Other Topics Concern   Not on file  Social History Narrative   Left handed   Caffeine- 1-2 cups    Lives at home with daughter   Social Determinants of Health   Financial Resource Strain: Not on file  Food Insecurity: Not on file  Transportation Needs: Not on file  Physical Activity: Not on file  Stress: Not on file  Social Connections: Not on file    Her Allergies Are:  Allergies  Allergen Reactions   Coconut Oil Hives   Imitrex [Sumatriptan] Hives   Morphine And Related Hives   Penicillins Hives    Did it  involve swelling of the face/tongue/throat, SOB, or low BP? No Did it involve sudden or severe rash/hives, skin peeling, or any reaction on the inside of your mouth or nose? Yes Did you need to seek medical attention at a hospital or doctor's office? No When did it last happen?     more than 5 yrs ago  If all above answers are NO, may proceed with cephalosporin use.    Toradol [Ketorolac Tromethamine] Hives    Tingling in tongue   Ibuprofen     Other reaction(s): GI Upset (intolerance)   Meloxicam     Other reaction(s): GI Upset (intolerance)  :   Her Current Medications Are:  Outpatient Encounter Medications as of 05/30/2021  Medication Sig   allopurinol (ZYLOPRIM) 100 MG tablet Take 100 mg by mouth daily.   atorvastatin (LIPITOR) 40 MG tablet Take 40 mg by mouth daily.   cetirizine (ZYRTEC) 10 MG tablet Take 10 mg by mouth daily.   diphenhydrAMINE (BENADRYL) 25 MG tablet Take 25 mg by mouth every 6 (six) hours as needed for allergies.   famotidine (PEPCID) 40 MG tablet Take 40 mg by mouth 2 (two) times daily.   gabapentin (NEURONTIN) 300 MG capsule Take 300 mg by mouth 4 (four) times daily.   HUMALOG KWIKPEN 100 UNIT/ML KwikPen SMARTSIG:20 Unit(s) SUB-Q Twice Daily   insulin glargine (LANTUS) 100 UNIT/ML injection Inject 40 Units into the skin at bedtime.   lisinopril-hydrochlorothiazide (ZESTORETIC) 20-12.5 MG tablet Take 1 tablet by mouth 2 (two) times daily.   metFORMIN (GLUCOPHAGE) 1000 MG tablet Take 1,000 mg by mouth 2 (two) times daily with a meal.   metoprolol tartrate (LOPRESSOR) 25 MG tablet Take 25 mg by mouth 2 (two) times daily.   montelukast (SINGULAIR) 10 MG tablet Take 10 mg by mouth daily.   Rimegepant Sulfate (NURTEC) 75 MG TBDP Take 75 mg by mouth as needed. Take one pill at the onset of migraine. May take one pill after 24 hours if headache persists. Do not take more than one pill in 24 hours.   Rimegepant Sulfate (NURTEC) 75 MG TBDP Take 75 mg by mouth as needed.    Vitamin D, Ergocalciferol, (DRISDOL) 1.25 MG (50000 UT) CAPS capsule Take 50,000 Units by mouth every 7 (seven) days.   methocarbamol (ROBAXIN) 500 MG tablet Take 1 tablet (500 mg total) by mouth 2 (two) times daily.   omeprazole (PRILOSEC) 20 MG capsule Take 1 capsule (20 mg total) by mouth daily.   No facility-administered encounter medications on file as of 05/30/2021.  :   Review of Systems:  Out of  a complete 14 point review of systems, all are reviewed and negative with the exception of these symptoms as listed below:   Review of Systems  Neurological:        Pt is here for sleep consult  Pt states she snores, has  headaches ,fatigue throughout the day,hypertension.Pt states she has had a sleep study 10+ years ago and has no CPAP machine   ESS:4 FSS :35   Objective:  Neurological Exam  Physical Exam Physical Examination:   Vitals:   05/30/21 1134  BP: 123/80  Pulse: 95    General Examination: The patient is a very pleasant 46 y.o. female in no acute distress. She appears well-developed and well-nourished and well groomed.   HEENT: Normocephalic, atraumatic, pupils are equal, round and reactive to light, extraocular tracking is good without limitation to gaze excursion or nystagmus noted. Hearing is grossly intact. Face is symmetric with normal facial animation. Speech is clear with no dysarthria noted. There is no hypophonia. There is no lip, neck/head, jaw or voice tremor. Neck is supple with full range of passive and active motion. There are no carotid bruits on auscultation. Oropharynx exam reveals: mild mouth dryness, adequate dental hygiene and mild airway crowding, due to small airway entry, redundant soft palate, Mallampati class III, tonsils on the smaller side.  Tongue protrudes centrally and palate elevates symmetrically.  Neck circumference of 16 3/8 inches.  She has a slight underbite and slightly skewed teeth alignment.  Chest: Clear to auscultation without  wheezing, rhonchi or crackles noted.  Heart: S1+S2+0, regular and normal without murmurs, rubs or gallops noted.   Abdomen: Soft, non-tender and non-distended with normal bowel sounds appreciated on auscultation.  Extremities: There is trace edema in the left ankle.    Skin: Warm and dry without trophic changes noted.   Musculoskeletal: exam reveals no obvious joint deformities.   Neurologically:  Mental status: The patient is awake, alert and oriented in all 4 spheres. Her immediate and remote memory, attention, language skills and fund of knowledge are appropriate. There is no evidence of aphasia, agnosia, apraxia or anomia. Speech is clear with normal prosody and enunciation. Thought process is linear. Mood is normal and affect is normal.  Cranial nerves II - XII are as described above under HEENT exam.  Motor exam: Normal bulk, strength and tone is noted. There is no tremor. Fine motor skills and coordination: grossly intact.  Cerebellar testing: No dysmetria or intention tremor. There is no truncal or gait ataxia.  Sensory exam: intact to light touch in the upper and lower extremities.  Gait, station and balance: She stands easily. No veering to one side is noted. No leaning to one side is noted. Posture is age-appropriate and stance is narrow based. Gait shows normal stride length and normal pace. No problems turning are noted.   Assessment and Plan:   In summary, Lisa Kennedy is a very pleasant 46 y.o.-year old female with history of hypertension, hyperlipidemia, migraine headaches, sciatica, cervical disc disease.  Asthma, carpal tunnel syndrome, diabetes, and severe obesity with a BMI of over 45, whose history and physical exam are concerning for obstructive sleep apnea (OSA). I had a long chat with the patient about my findings and the diagnosis of OSA, its prognosis and treatment options. We talked about medical treatments, surgical interventions and non-pharmacological  approaches. I explained in particular the risks and ramifications of untreated moderate to severe OSA, especially with respect to developing cardiovascular disease down the Road, including  congestive heart failure, difficult to treat hypertension, cardiac arrhythmias, or stroke. Even type 2 diabetes has, in part, been linked to untreated OSA. Symptoms of untreated OSA include daytime sleepiness, memory problems, mood irritability and mood disorder such as depression and anxiety, lack of energy, as well as recurrent headaches, especially morning headaches. We talked about trying to maintain a healthy lifestyle in general, as well as the importance of weight control. We also talked about the importance of good sleep hygiene. I recommended the following at this time: sleep study.  I include the differences between a laboratory attended sleep study versus home sleep test.  She had a sleep study some 10 years ago in the lab, [place]  She reports that she did not have sleep apnea at the time and she had snoring. I explained the sleep test procedure to the patient and also outlined possible surgical and non-surgical treatment options of OSA, including the use of a custom-made dental device (which would require a referral to a specialist dentist or oral surgeon), upper airway surgical options, such as traditional UPPP or a novel less invasive surgical option in the form of Inspire hypoglossal nerve stimulation (which would involve a referral to an ENT surgeon). I also explained the CPAP treatment option to the patient, who indicated that she would be willing to try PAP if the need arises.  We will pick up our discussion after testing.  We will keep her posted as to her test results by phone call as well, and plan to follow-up in sleep clinic accordingly.  I answered all her questions today and she was in agreement. Thank you very much for allowing me to participate in the care of this nice patient. If I  can be of any further assistance to you please do not hesitate to call me at [phone].  Sincerely,   Star Age, MD, PhD

## 2021-05-30 NOTE — Patient Instructions (Signed)

## 2021-05-31 DIAGNOSIS — Z0289 Encounter for other administrative examinations: Secondary | ICD-10-CM

## 2021-06-08 NOTE — Telephone Encounter (Signed)
Sure, we can give her 4 days off per month for intermittent FMLA

## 2021-06-09 NOTE — Telephone Encounter (Signed)
FMLA pw has been reviewed and signed by MD.  ?Fwd back to medical records for processing. ?

## 2021-06-09 NOTE — Telephone Encounter (Signed)
FMLA papers have been completed and placed on MD's desk for review/signature.  ?

## 2021-06-15 ENCOUNTER — Ambulatory Visit (INDEPENDENT_AMBULATORY_CARE_PROVIDER_SITE_OTHER): Payer: 59 | Admitting: Neurology

## 2021-06-15 DIAGNOSIS — G4734 Idiopathic sleep related nonobstructive alveolar hypoventilation: Secondary | ICD-10-CM

## 2021-06-15 DIAGNOSIS — G4733 Obstructive sleep apnea (adult) (pediatric): Secondary | ICD-10-CM

## 2021-06-15 DIAGNOSIS — R0683 Snoring: Secondary | ICD-10-CM

## 2021-06-15 DIAGNOSIS — R519 Headache, unspecified: Secondary | ICD-10-CM

## 2021-06-15 DIAGNOSIS — G4719 Other hypersomnia: Secondary | ICD-10-CM

## 2021-06-15 DIAGNOSIS — R351 Nocturia: Secondary | ICD-10-CM

## 2021-06-20 NOTE — Progress Notes (Signed)
See procedure note.

## 2021-06-21 NOTE — Procedures (Signed)
? ?  [facility] ? ?HOME SLEEP TEST (Watch PAT) REPORT ? ?STUDY DATE: 06/15/2021 ? ?DOB: 31-Dec-1975 ? ?MRN: [id number] ? ?ORDERING CLINICIAN: Star Age, MD, PhD ?  ?REFERRING CLINICIAN: Dr. Genia Harold ? ?CLINICAL INFORMATION/HISTORY: 46 year old right-handed woman with history of hypertension, hyperlipidemia, migraine headaches, sciatica, cervical disc disease.  Asthma, carpal tunnel syndrome, diabetes, and severe obesity with a BMI of over 45, who reports snoring and excessive daytime somnolence, not sleeping through the night and waking up with a headache. ? ?Epworth sleepiness score: 4/24. ? ?BMI: 45.9 kg/m? ? ?FINDINGS:  ? ?Sleep Summary:  ? ?Total Recording Time (hours, min): 8 hours, 25 minutes ? ?Total Sleep Time (hours, min):  7 hours, 52 minutes  ? ?Percent REM (%):    36.4%  ? ?Respiratory Indices:  ? ?Calculated pAHI (per hour):  25.3/hour        ? ?REM pAHI:    51.4/hour      ? ?NREM pAHI: 10.8/hour ? ?Oxygen Saturation Statistics:  ?  ?Oxygen Saturation (%) Mean: 90%  ? ?Minimum oxygen saturation (%):                 74%  ? ?O2 Saturation Range (%): 74-98%   ? ?O2 Saturation (minutes) <=88%: 60.7 minutes ? ?Pulse Rate Statistics:  ? ?Pulse Mean (bpm):    90/min   ? ?Pulse Range (58 -118/min)  ? ?IMPRESSION: OSA (obstructive sleep apnea)  ?Nocturnal hypoxemia ? ?RECOMMENDATION:  ?This home sleep test demonstrates moderate obstructive sleep apnea with a total AHI of 25.3/hour and O2 nadir of 74% with significant time below or at 88% saturation of over 1 hour for the night.  Intermittent mild to moderate snoring was detected.  Treatment with positive airway pressure is recommended. The patient will be advised to proceed with an autoPAP titration/trial at home for now. A full night titration study may be considered to optimize treatment settings, if needed down the road. Please note that untreated obstructive sleep apnea may carry additional perioperative morbidity. Patients with  significant obstructive sleep apnea should receive perioperative PAP therapy and the surgeons and particularly the anesthesiologist should be informed of the diagnosis and the severity of the sleep disordered breathing.  Alternative treatment options may include an oral appliance versus surgical options such as an implanted hypoglossal nerve stimulator, in selected candidates.  Concomitant weight loss is highly recommended. ?The patient should be cautioned not to drive, work at heights, or operate dangerous or heavy equipment when tired or sleepy. Review and reiteration of good sleep hygiene measures should be pursued with any patient. ?Other causes of the patient's symptoms, including circadian rhythm disturbances, an underlying mood disorder, medication effect and/or an underlying medical problem cannot be ruled out based on this test. Clinical correlation is recommended. The patient and her referring provider will be notified of the test results. The patient will be seen in follow up in sleep clinic at [facility]. ? ?I certify that I have reviewed the raw data recording prior to the issuance of this report in accordance with the standards of the American Academy of Sleep Medicine (AASM). ? ?INTERPRETING PHYSICIAN:  ? ?Star Age, MD, PhD  ?Board Certified in Neurology and Sleep Medicine ? ?[facility] ?[address], [address] ?[place], [place] [address] ?[phone] ? ? ? ? ? ? ? ? ? ? ? ? ? ? ? ? ? ? ?

## 2021-06-21 NOTE — Addendum Note (Signed)
Addended by: Star Age on: 06/21/2021 04:57 PM ? ? Modules accepted: Orders ? ?

## 2021-06-22 ENCOUNTER — Telehealth: Payer: Self-pay | Admitting: *Deleted

## 2021-06-22 NOTE — Telephone Encounter (Signed)
-----   Message from Huston Foley, MD sent at 06/21/2021  4:57 PM EDT ----- ?Patient referred by Dr. Delena Bali, seen by me on 05/30/2021, patient had a HST on 06/15/2021.   ? ?Please call and notify the patient that the recent home sleep test showed obstructive sleep apnea in the moderate range. I recommend treatment in the form of autoPAP, which means, that we don't have to bring her in for a sleep study with CPAP, but will let her start using a so called autoPAP machine at home, which is a CPAP-like machine with self-adjusting pressures. We will send the order to a local DME company (of her choice, or as per insurance requirement). The DME representative will fit her with a mask, educate her on how to use the machine, how to put the mask on, etc. I have placed an order in the chart. Please send the order, talk to patient, send report to referring MD. We will need a FU in sleep clinic for 10 weeks post-PAP set up, please arrange that with me or one of our NPs. Also reinforce the need for compliance with treatment. Thanks,  ? ?Huston Foley, MD, PhD ?[facility]) ? ? ? ? ?

## 2021-06-22 NOTE — Telephone Encounter (Signed)
Spoke with patient and discussed sleep study results. Pt verbalized understanding that her sleep study showed moderate OSA.  Patient is amenable to trying an AutoPap machine.  We discussed the difference between AutoPap and CPAP.  The patient does not have a preference of DME company except one that takes her insurance. Referring to [employer].  Patient aware they will give her a call within a week but if she does not hear from them please call.  Patient prefers follow-up letter through MyChart.  Initial appointment has been scheduled for June 19th at 9:45 AM.  Patient aware to bring her machine and power cord with her to the appointment.  Also discussed insurance will require this appointment to occur between 31 and 90 days after set up and insurance also requires patient to use the machine at least 4 hours at night.  Patient's questions were answered.  She stated Dr. Delena Bali wanted her to follow-up after the sleep study.  I let her know I would check with the provider to see how long she should wait before she follows up since she will be starting AutoPap.  Patient amenable to waiting if needed.  She verbalized appreciation for the call. ? ?Orders have been sent to [employer].  Letter sent to patient.  Message sent to Dr. Delena Bali with result noted asking about follow-up. ?

## 2021-06-23 ENCOUNTER — Encounter: Payer: Self-pay | Admitting: *Deleted

## 2021-06-23 NOTE — Progress Notes (Signed)
She can follow up with me in June. That will give her some time to use the autoPAP and see if it helps with her headaches. Thanks

## 2021-07-18 ENCOUNTER — Telehealth: Payer: Self-pay | Admitting: Neurology

## 2021-07-25 NOTE — Telephone Encounter (Signed)
Error

## 2021-08-29 ENCOUNTER — Encounter: Payer: Self-pay | Admitting: Psychiatry

## 2021-09-12 ENCOUNTER — Ambulatory Visit (INDEPENDENT_AMBULATORY_CARE_PROVIDER_SITE_OTHER): Payer: 59 | Admitting: Neurology

## 2021-09-12 ENCOUNTER — Encounter: Payer: Self-pay | Admitting: Neurology

## 2021-09-12 VITALS — BP 110/76 | HR 87 | Ht 64.0 in | Wt 271.6 lb

## 2021-09-12 DIAGNOSIS — Z9989 Dependence on other enabling machines and devices: Secondary | ICD-10-CM | POA: Diagnosis not present

## 2021-09-12 DIAGNOSIS — G4734 Idiopathic sleep related nonobstructive alveolar hypoventilation: Secondary | ICD-10-CM | POA: Diagnosis not present

## 2021-09-12 DIAGNOSIS — G4733 Obstructive sleep apnea (adult) (pediatric): Secondary | ICD-10-CM

## 2021-09-12 DIAGNOSIS — Z789 Other specified health status: Secondary | ICD-10-CM | POA: Diagnosis not present

## 2021-09-12 NOTE — Patient Instructions (Addendum)
Please continue using your autoPAP regularly. While your insurance requires that you use PAP at least 4 hours each night on 70% of the nights, I recommend, that you not skip any nights and use it throughout the night if you can. Getting used to PAP and staying with the treatment long term does take time and patience and discipline. Untreated obstructive sleep apnea when it is moderate to severe can have an adverse impact on cardiovascular health and raise her risk for heart disease, arrhythmias, hypertension, congestive heart failure, stroke and diabetes. Untreated obstructive sleep apnea causes sleep disruption, nonrestorative sleep, and sleep deprivation. This can have an impact on your day to day functioning and cause daytime sleepiness and impairment of cognitive function, memory loss, mood disturbance, and problems focussing. Using PAP regularly can improve these symptoms.  Keep up the good work!  You are currently compliant with treatment, your leak from the mask has been low at times and sometimes it does go higher.  Please talk to your DME provider, aero care about adjusting your mask or trying something different.  Please try the sample mask, which is the [facility] from [employer], size small.  As discussed, we will do an overnight oxygen level test, called ONO, and your DME company will call and set this up for one night, while you also use your autoPAP as usual. We will call you with the results. This is to make sure that your oxygen levels stay in the 90s, while you are treated with autoPAP for your OSA. Remember, your oxygen levels dropped into the mid 70s during the home sleep test.  We can see you in 1 year, you can see one of our nurse practitioners.

## 2021-09-12 NOTE — Progress Notes (Signed)
Subjective:    Patient ID: Lisa Kennedy is a 46 y.o. female.  HPI    Interim history:  Lisa Kennedy is a 46 year old right-handed woman with history of hypertension, hyperlipidemia, migraine headaches, sciatica, cervical disc disease, asthma, carpal tunnel syndrome, diabetes, and severe obesity with a BMI of over 56, who presents for consultation of her obstructive sleep for sleep testing and starting AutoPap therapy.  The patient is unaccompanied today.  I first met her at the request of Dr. Billey Gosling on 05/30/2021, at which time she reported morning headaches, snoring and difficulty maintaining sleep.  She was advised to proceed with a sleep study.  She had a home sleep test on 06/15/2021 which indicated moderate obstructive sleep apnea with an AHI of 25.3/h, O2 nadir 74% with time below or at 88% saturation of over 1 hour.  She was advised to proceed with AutoPap therapy.  Her set up date was 06/29/21 and she has a ResMed air sense 11 AutoSet machine.  Today, 09/12/2021: I reviewed her AutoPap compliance data from 08/09/2021 through 09/07/2021, which is a total of 30 days, during which time she used her machine 27 days with percent use days greater than 4 hours at 80%, indicating very good compliance with an average usage of 5 hours and 58 minutes, residual AHI at goal at 0.4/h, average pressure for the 95th percentile at 12.4 cm with a range of 5 to 13 cm, leak acceptable with the 95th percentile at 14.2 L/min.  She reports overall doing quite well with her AutoPap, she is struggling somewhat with her interface, she has a small F20 fullface mask and sometimes the leak bothers her and it blows into her eyes.  Overall, her morning headaches are better and daytime energy are better.  She is very motivated to continue with treatment and admits that she has benefited from it.  She would be willing to try a different interface.  She is working on weight loss.  I was able to provide her with a Lisa Kennedy fullface mask sample in small size.  The patient's allergies, current medications, family history, past medical history, past social history, past surgical history and problem list were reviewed and updated as appropriate.   Previously:   05/30/21: (She) reports snoring and excessive daytime somnolence, not sleeping through the night and waking up with a headache.  I reviewed your office note from 01/11/2021.  Her Epworth sleepiness score is 4/24, fatigue severity score is 35 out of 63.  Around 10 but is typically not asleep until midnight or after midnight.  Her rise time is around 8 AM.  She works from home.  She has nocturia about 2-3 times per average night.  She has no family history of sleep apnea other than sleep apnea diagnosed in her youngest child when she was little and she had a tonsillectomy and adenoidectomy at the time.  Patient's weight has been fluctuating but she is working on weight loss.  She likes to read at bedtime.  She has a history of palpitations and tachycardia.  She had seen a cardiologist in the past and had a heart monitor as well.  She has never been diagnosed with A-fib.   Her Past Medical History Is Significant For: Past Medical History:  Diagnosis Date   Asthma    Blood transfusion without reported diagnosis 03/2008   During Hysterectomy    Carpal tunnel syndrome    Diabetes mellitus without complication ([place])    type  2   Fluid collection of middle ear    Gout    Herniated disc, cervical    Lumbar as well   History of vitamin D deficiency    Hyperlipidemia    Hypertension    Irregular heartbeat    Migraine    Neuropathy    Sciatica     Her Past Surgical History Is Significant For: Past Surgical History:  Procedure Laterality Date   ABDOMINAL HYSTERECTOMY  2010   APPENDECTOMY  2010   CARPAL TUNNEL RELEASE  2008   CHOLECYSTECTOMY  1995   TRIGGER FINGER RELEASE     x 4   TUBAL LIGATION  1997    Her Family History Is Significant For: Family  History  Problem Relation Age of Onset   Hypertension Mother    Hypokalemia Mother    Gout Mother    Kidney disease Mother    Heart murmur Mother    Mental illness Father    Asthma Sister    Asthma Brother    Sleep apnea Paternal Aunt    Stroke Maternal Grandmother    Breast cancer Maternal Grandmother    Colon cancer Maternal Grandfather    Sleep apnea Daughter    Diabetes Maternal Great-grandmother     Her Social History Is Significant For: Social History   Socioeconomic History   Marital status: Legally Separated    Spouse name: Not on file   Number of children: 2   Years of education: Not on file   Highest education level: Associate degree: academic program  Occupational History   Not on file  Tobacco Use   Smoking status: Never   Smokeless tobacco: Never  Vaping Use   Vaping Use: Never used  Substance and Sexual Activity   Alcohol use: Yes    Comment: occassionally   Drug use: Never   Sexual activity: Not Currently    Birth control/protection: Surgical  Other Topics Concern   Not on file  Social History Narrative   Left handed   Caffeine- 1-2 cups    Lives at home with daughter   Social Determinants of Health   Financial Resource Strain: Not on file  Food Insecurity: Not on file  Transportation Needs: No Transportation Needs (01/28/2019)   PRAPARE - Hydrologist (Medical): No    Lack of Transportation (Non-Medical): No  Physical Activity: Not on file  Stress: Not on file  Social Connections: Not on file    Her Allergies Are:  Allergies  Allergen Reactions   Coconut (Cocos Nucifera) Hives   Imitrex [Sumatriptan] Hives   Morphine And Related Hives   Penicillins Hives    Did it involve swelling of the face/tongue/throat, SOB, or low BP? No Did it involve sudden or severe rash/hives, skin peeling, or any reaction on the inside of your mouth or nose? Yes Did you need to seek medical attention at a hospital or doctor's  office? No When did it last happen?     more than 5 yrs ago  If all above answers are "NO", may proceed with cephalosporin use.    Toradol [Ketorolac Tromethamine] Hives    Tingling in tongue   Ibuprofen     Other reaction(s): GI Upset (intolerance)   Meloxicam     Other reaction(s): GI Upset (intolerance)  :   Her Current Medications Are:  Outpatient Encounter Medications as of 09/12/2021  Medication Sig   allopurinol (ZYLOPRIM) 100 MG tablet Take 100 mg by mouth daily.  atorvastatin (LIPITOR) 40 MG tablet Take 40 mg by mouth daily.   cetirizine (ZYRTEC) 10 MG tablet Take 10 mg by mouth daily.   famotidine (PEPCID) 40 MG tablet Take 40 mg by mouth 2 (two) times daily.   gabapentin (NEURONTIN) 300 MG capsule Take 300 mg by mouth 4 (four) times daily.   HUMALOG KWIKPEN 100 UNIT/ML KwikPen SMARTSIG:20 Unit(s) SUB-Q Twice Daily   insulin glargine (LANTUS) 100 UNIT/ML injection Inject 40 Units into the skin at bedtime.   lisinopril-hydrochlorothiazide (ZESTORETIC) 20-12.5 MG tablet Take 1 tablet by mouth 2 (two) times daily.   metFORMIN (GLUCOPHAGE) 1000 MG tablet Take 1,000 mg by mouth 2 (two) times daily with a meal.   metoprolol tartrate (LOPRESSOR) 25 MG tablet Take 25 mg by mouth 2 (two) times daily.   montelukast (SINGULAIR) 10 MG tablet Take 10 mg by mouth daily.   Rimegepant Sulfate (NURTEC) 75 MG TBDP Take 75 mg by mouth as needed. Take one pill at the onset of migraine. May take one pill after 24 hours if headache persists. Do not take more than one pill in 24 hours.   Rimegepant Sulfate (NURTEC) 75 MG TBDP Take 75 mg by mouth as needed.   diphenhydrAMINE (BENADRYL) 25 MG tablet Take 25 mg by mouth every 6 (six) hours as needed for allergies.   methocarbamol (ROBAXIN) 500 MG tablet Take 1 tablet (500 mg total) by mouth 2 (two) times daily.   omeprazole (PRILOSEC) 20 MG capsule Take 1 capsule (20 mg total) by mouth daily.   Vitamin D, Ergocalciferol, (DRISDOL) 1.25 MG (50000 UT)  CAPS capsule Take 50,000 Units by mouth every 7 (seven) days.   No facility-administered encounter medications on file as of 09/12/2021.  :  Review of Systems:  Out of a complete 14 point review of systems, all are reviewed and negative with the exception of these symptoms as listed below:   Review of Systems  Neurological:        Pt is here for CPAP follow up  Pt states she  has issues with mask . Pt states hard to adjust mask to face . Pt states she feels air blow in eyes when mask is on. Pt states last night she smelled something burning when mask was on     Objective:  Neurological Exam  Physical Exam Physical Examination:   Vitals:   09/12/21 0945  BP: 110/76  Pulse: 87    General Examination: The patient is a very pleasant 46 y.o. female in no acute distress. She appears well-developed and well-nourished and well groomed.   HEENT: Normocephalic, atraumatic, pupils are equal, round and reactive to light, extraocular tracking is good without limitation to gaze excursion or nystagmus noted. Hearing is grossly intact. Face is symmetric with normal facial animation. Speech is clear with no dysarthria noted. There is no hypophonia. There is no lip, neck/head, jaw or voice tremor. Neck is with FROM, no carotid bruits on auscultation. Oropharynx exam reveals: mild mouth dryness, adequate dental hygiene and mild airway crowding, tongue protrudes centrally and palate elevates symmetrically.    Chest: Clear to auscultation without wheezing, rhonchi or crackles noted.   Heart: S1+S2+0, regular and normal without murmurs, rubs or gallops noted.    Abdomen: Soft, non-tender and non-distended.   Extremities: There is trace edema in the ankles.     Skin: Warm and dry without trophic changes noted.    Musculoskeletal: exam reveals no obvious joint deformities.    Neurologically:  Mental status: The  patient is awake, alert and oriented in all 4 spheres. Her immediate and remote memory,  attention, language skills and fund of knowledge are appropriate. There is no evidence of aphasia, agnosia, apraxia or anomia. Speech is clear with normal prosody and enunciation. Thought process is linear. Mood is normal and affect is normal.  Cranial nerves II - XII are as described above under HEENT exam.  Motor exam: Normal bulk, strength and tone is noted. There is no tremor. Fine motor skills and coordination: grossly intact.  Cerebellar testing: No dysmetria or intention tremor. There is no truncal or gait ataxia.  Sensory exam: intact to light touch in the upper and lower extremities.  Gait, station and balance: She stands easily. No veering to one side is noted. No leaning to one side is noted. Posture is age-appropriate and stance is narrow based. Gait shows normal stride length and normal pace. No problems turning are noted.    Assessment and Plan:    In summary, Lisa Kennedy is a very pleasant 46 year old female with history of hypertension, hyperlipidemia, migraine headaches, sciatica, cervical disc disease, asthma, carpal tunnel syndrome, diabetes, and severe obesity with a BMI of over 56, who presents for follow-up consultation of her obstructive sleep apnea after interim testing and starting AutoPap therapy in early April 2023.  She has been compliant with treatment and has benefited from it.  She is highly commended for her treatment adherence.  She is struggling somewhat with her traditional fullface mask and was given a sample for a hybrid style fullface mask from [employer].  She is agreeable to trying this and if she likes it she can order her replacement through her DME company.  Given that she had an O2 nadir of 74% during her home sleep test on 06/15/2021 (which showed an AHI of 25.3/h, O2 nadir 74% with time below or at 88% saturation of over 1 hour for the night), I would like for her to have an overnight pulse oximetry test while on AutoPap therapy.  We will set this up through  her DME company for one night and call her with her results.  Hopefully, her oxygen saturations are adequate on the current treatment settings.  We will plan a sleep apnea follow-up routinely in this clinic for her to see one of our nurse practitioners in 1 year.  She can continue to follow-up with Dr. Billey Gosling as scheduled as well.  I answered all her questions today and she was in agreement. I spent 40 minutes in total face-to-face time and in reviewing records during pre-charting, more than 50% of which was spent in counseling and coordination of care, reviewing test results, reviewing medications and treatment regimen and/or in discussing or reviewing the diagnosis of OSA, the prognosis and treatment options. Pertinent laboratory and imaging test results that were available during this visit with the patient were reviewed by me and considered in my medical decision making (see chart for details).

## 2021-09-16 ENCOUNTER — Encounter: Payer: Self-pay | Admitting: Neurology

## 2021-10-06 ENCOUNTER — Telehealth: Payer: Self-pay | Admitting: Neurology

## 2021-10-06 DIAGNOSIS — G4734 Idiopathic sleep related nonobstructive alveolar hypoventilation: Secondary | ICD-10-CM

## 2021-10-06 DIAGNOSIS — G4733 Obstructive sleep apnea (adult) (pediatric): Secondary | ICD-10-CM

## 2021-10-06 NOTE — Telephone Encounter (Signed)
I received patient's pulse oximetry report from [employer] [facility] from 09/16/2021, start time was 1:23 AM, end time was 7:10 AM.  Reportedly, patient was on AutoPap therapy on room air at the time.  Average oxygen saturation was 90%, nadir was 63%.  Time below or at 88% saturation was 1 hour and 49 minutes.  Please verify that patient was indeed using her AutoPap that night/morning of the oxygen test.  Can you pull a download for that day?  If she was indeed using her machine at the time, her oxygen saturations are not adequate while on treatment.  We will need to bring her in for a formal titration study in that case.

## 2021-10-06 NOTE — Telephone Encounter (Signed)
Download from 09/16/21 ready for review.

## 2021-10-06 NOTE — Telephone Encounter (Signed)
Her pulse oximetry test was over 5 hours on 09/16/2021, AutoPap usage was recorded at 4 hours and 23 minutes.  Not sure how these 2 overlapped but ONO test results were longer.  I would recommend trying to bring her in for a proper titration study at this point.

## 2021-10-10 NOTE — Telephone Encounter (Signed)
I called pt and she has to call me back, as she is at work.

## 2021-10-11 NOTE — Telephone Encounter (Signed)
I will call pt later this afternoon as she called yesterday afternoon as she was working.

## 2021-10-11 NOTE — Telephone Encounter (Signed)
Spoke with Dr. Frances Furbish verbally on this and she showed me where to locate the ONO in the chart. See second telephone note from 10/06/2021. I have updated pt of this information via mychart.

## 2021-10-11 NOTE — Telephone Encounter (Signed)
I called the pt and relayed information she is agreeable to titration study. She will await a call from the sleep lab.   She did ask if the recent ONO showed the lowest oxygen sat?

## 2021-11-21 ENCOUNTER — Ambulatory Visit: Payer: 59 | Admitting: Psychiatry

## 2021-11-29 ENCOUNTER — Telehealth: Payer: Self-pay | Admitting: Neurology

## 2021-11-29 NOTE — Telephone Encounter (Signed)
[employer] pending uploaded notes on the portal  

## 2021-12-01 ENCOUNTER — Encounter: Payer: Self-pay | Admitting: Psychiatry

## 2021-12-02 ENCOUNTER — Encounter: Payer: Self-pay | Admitting: Psychiatry

## 2021-12-02 ENCOUNTER — Encounter: Payer: Self-pay | Admitting: Neurology

## 2021-12-05 NOTE — Telephone Encounter (Signed)
Checked the status on he portal it is still pending.

## 2021-12-12 NOTE — Telephone Encounter (Signed)
LVM for pt to call back to schedule   [facility] auth: [id number] (exp. 11/29/21 to 03/17/22)

## 2021-12-13 NOTE — Telephone Encounter (Signed)
Patient returned my call.  CPAP titration- [employer] auth: [id number] (exp. 11/29/21 to 03/17/22)   She is scheduled at [facility] for 01/03/22 at 8 pm.  Mailed packet to the patient & sent mychart message.

## 2021-12-27 ENCOUNTER — Ambulatory Visit (INDEPENDENT_AMBULATORY_CARE_PROVIDER_SITE_OTHER): Payer: 59 | Admitting: Psychiatry

## 2021-12-27 ENCOUNTER — Telehealth: Payer: Self-pay

## 2021-12-27 VITALS — BP 93/62 | HR 96 | Ht 64.5 in | Wt 252.1 lb

## 2021-12-27 DIAGNOSIS — G43109 Migraine with aura, not intractable, without status migrainosus: Secondary | ICD-10-CM | POA: Diagnosis not present

## 2021-12-27 MED ORDER — NURTEC 75 MG PO TBDP: 75.0000 mg | ORAL_TABLET | ORAL | 6 refills | Status: DC

## 2021-12-27 NOTE — Telephone Encounter (Signed)
PA for Nurtec has been sent to plan. Awaiting determination from [facility]. KEY: [id number]

## 2021-12-27 NOTE — Progress Notes (Signed)
   CC:  headaches  Follow-up Visit  Last visit: 01/11/21  Brief HPI: 46 year old female with a history of recurrent sinusitis, HLD, DM, kidney stones, OSA on autoPAP who follows in clinic for headaches.  At her last visit she was started on Nurtec for migraine rescue and referred to Sleep for OSA evaluation.  Interval History: She was found to have moderate OSA and was started on autoPAP. Headache severity has improved with use of the autoPAP, though she does continue to have 1-2 migraines per week. Notes that her migraines are typically worse around this time of year. Nurtec does help reduce her headache when she takes it for rescue. She tolerates this well without side effects.  Headache days per month: 8 Headache free days per month: 22  Current Headache Regimen: Preventative: none Abortive: Nurtec 75 mg PRN   Prior Therapies                                  Imitrex - jittery, hives Nurtec 75 mg PRN Fioricet Tylenol Robaxin Gabapentin 300 mg four times a day Metoprolol 25 mg BID Topamax contraindicated due to kidney stones  Physical Exam:   Vital Signs: BP 93/62   Pulse 96   Ht 5' 4.5" (1.638 m)   Wt 252 lb 2 oz (114.4 kg)   BMI 42.61 kg/m  GENERAL:  well appearing, in no acute distress, alert  SKIN:  Color, texture, turgor normal. No rashes or lesions HEAD:  Normocephalic/atraumatic. RESP: normal respiratory effort MSK:  No gross joint deformities.   NEUROLOGICAL: Mental Status: Alert, oriented to person, place and time, Follows commands, and Speech fluent and appropriate. Cranial Nerves: PERRL, face symmetric, no dysarthria, hearing grossly intact Motor: moves all extremities equally Gait: normal-based.  IMPRESSION: 47 year old female with a history of recurrent sinusitis, HLD, DM, kidney stones, OSA on autoPAP who presents for follow up of migraines. She has had some improvement in her headaches since starting autoPAP but continues to have ~8 migraines per  month. Will have her start taking Nurtec every other day for migraine prevention.  PLAN: -Take Nurtec 75 mg every other day for migraine prevention -next steps: consider SNRI, CGRP, Qulipta for headache prevention  Follow-up: 3 months  I spent a total of 16 minutes on the date of the service. Headache education was done. Discussed treatment options including preventive and acute medications.  Discussed medication side effects, adverse reactions and drug interactions. Written educational materials and patient instructions outlining all of the above were given.  Genia Harold, MD 12/27/21 1:33 PM

## 2021-12-29 ENCOUNTER — Encounter: Payer: Self-pay | Admitting: *Deleted

## 2021-12-29 NOTE — Telephone Encounter (Signed)
NURTEC TAB 75MG  ODT, use as directed (18 per month), is approved through 12/28/2022. Sent patient my chart to advise.

## 2022-01-03 ENCOUNTER — Ambulatory Visit (INDEPENDENT_AMBULATORY_CARE_PROVIDER_SITE_OTHER): Payer: 59 | Admitting: Neurology

## 2022-01-03 DIAGNOSIS — G4734 Idiopathic sleep related nonobstructive alveolar hypoventilation: Secondary | ICD-10-CM | POA: Diagnosis not present

## 2022-01-03 DIAGNOSIS — G4733 Obstructive sleep apnea (adult) (pediatric): Secondary | ICD-10-CM

## 2022-01-03 DIAGNOSIS — G4720 Circadian rhythm sleep disorder, unspecified type: Secondary | ICD-10-CM

## 2022-01-10 NOTE — Addendum Note (Signed)
Addended by: Star Age on: 01/10/2022 06:52 PM   Modules accepted: Orders

## 2022-01-10 NOTE — Procedures (Signed)
Physician Interpretation:     [facility] PAP TITRATION INTERPRETATION REPORT   STUDY DATE: 01/03/2022      PATIENT NAME:  Lisa Kennedy         DATE OF BIRTH:  [birth year]  PATIENT ID:  [id number]    TYPE OF STUDY:  CPAP  READING PHYSICIAN: Star Age, MD, PhD SCORING TECHNICIAN: Richard Miu, RPSGT   HISTORY: 46 year old right-handed woman with history of hypertension, hyperlipidemia, migraine headaches, sciatica, cervical disc disease, asthma, carpal tunnel syndrome, diabetes, and severe obesity with a BMI of over 29, who presents for a full night titration study to optimize her treatment for sleep apnea. She has been on AutoPap therapy but has had oxygen desaturations while on AutoPap therapy.    Height: 64.0 in Weight: 271 lb (BMI 46) Neck Size: 16.5 in    MEDICATIONS: Zyloprim, Lipitor, Zyrtec, Pepcid, Neurontin, Humalog, Lantus, Zestoretic, Glucophage, Lopressor, Singulair, Nurtec, Benadryl, Robaxin, Prilosec, Vitamin D  DESCRIPTION: A sleep technologist was in attendance for the duration of the recording.  Data collection, scoring, video monitoring, and reporting were performed in compliance with the AASM Manual for the Scoring of Sleep and Associated Events; (Hypopnea is scored based on the criteria listed in Section VIII D. 1b in the AASM Manual V2.6 using a 4% oxygen desaturation rule or Hypopnea is scored based on the criteria listed in Section VIII D. 1a in the AASM Manual V2.6 using 3% oxygen desaturation and /or arousal rule).  A physician certified by the American Board of Sleep Medicine reviewed each epoch of the study.   SLEEP CONTINUITY AND SLEEP ARCHITECTURE:  Lights off was at 20:37: and lights on 05:26: (8.8 hours in bed). Total sleep time was 414.0 minutes (100.0% supine;  0.0% lateral;  0.0% prone, 10.1% REM sleep), with a decreased sleep efficiency at 78.3%. Sleep latency was increased at 61.5 minutes. REM latency markedly delayed at  4 hours, 9 min.  Of the total sleep time, the percentage of stage N1 sleep was 1.4%, stage N2 sleep was 64.4%, which is increased, stage N3 sleep was 24.0%, and REM sleep was 10.1%, which is reduced. Wake after sleep onset (WASO) time accounted for 53 minutes with mild to moderate sleep fragmentation noted.  AROUSAL: There were 25 arousals in total, for an arousal index of 3.6 arousals/hour.  Of these, 3 were identified as respiratory-related arousals (0.4 /h), 0 were PLM-related arousals (0.0 /h), and 31 were non-specific arousals (4.5 /h)  RESPIRATORY MONITORING:  Based on CMS criteria (using a 4% oxygen desaturation rule for scoring hypopneas), there were 0 apneas (0 obstructive; 0 central; 0 mixed), and 17 hypopneas.  Apnea index was 0.0. Hypopnea index was 2.5. The apnea-hypopnea index was 2.5 overall (2.5 supine, 0.0 non-supine; 1.4 REM, 1.4 supine REM). There were 0 respiratory effort-related arousals (RERAs).  The RERA index was 0.0 events/h. Total respiratory disturbance index (RDI) was 2.5 events/h. RDI results showed: supine RDI  2.5 /h; non-supine RDI 0.0 /h; REM RDI 1.4 /h, supine REM RDI 1.4 /h.   Based on AASM criteria (using a 3% oxygen desaturation and /or arousal rule for scoring hypopneas), there were 0 apneas (0 obstructive; 0 central; 0 mixed), and 17 hypopneas. Apnea index was 0.0. Hypopnea index was 2.5. The apnea-hypopnea index was 2.5 overall (2.5 supine, 0.0 non-supine; 1.4 REM, 1.4 supine REM). There were 0 respiratory effort-related arousals (RERAs).  The RERA index was 0.0 events/h. Total respiratory disturbance index (RDI) was 2.5 events/h. RDI results  showed: supine RDI  2.5 /h; non-supine RDI 0.0 /h; REM RDI 1.4 /h, supine REM RDI 1.4 /h.  Respiratory events were associated with oxyhemoglobin desaturations (nadir during sleep 86%) from a mean of 93%).  OXIMETRY: Total sleep time spent at, or below 88% was 9.1 minutes, or 2.2% of total sleep time. Snoring was classified as  improved with PAP therapy. BODY POSITION: Duration of total sleep and percent of total sleep in their respective position is as follows: supine 414 minutes (100.0%), non-supine 0.0 minutes (0.0%); right 00 minutes (0.0%), left 00 minutes (0.0%), and prone 00 minutes (0.0%). Total supine REM sleep time was 42 minutes (100.0% of total REM sleep). LIMB MOVEMENTS: There were 0 periodic limb movements of sleep (0.0/h), of which 0 (0.0/h) were associated with an arousal.  EEG: Review of the EEG showed no abnormal electrical discharges and symmetrical bihemispheric findings.   EKG: The EKG revealed normal sinus rhythm (NSR).  AUDIO/VIDEO REVIEW: The audio and video review did not show any abnormal or unusual behaviors, movements, phonations or vocalizations. The patient took to restroom breaks. Snoring was improved with optimized CPAP therapy. POST-STUDY QUESTIONNAIRE: Post study, the patient indicated, that sleep was the same as usual.  TITRATION DETAILS (SEE ALSO TABLE AT THE END OF THE REPORT):  The patient was fitted with a small ResMed of 20 fullface mask.  She has been using a nasal mask at home but this was noted to leak.  CPAP was initiated at a pressure of 9 cm and titrated to a final pressure of 14 cm, at which time the patient achieved a total sleep time of 218 minutes with 19.3% of REM sleep achieved.  Her AHI was reduced to 1.1/h, O2 nadir 89% and supine REM sleep was achieved. supplemental oxygen was not needed during the study. IMPRESSION:   1. Obstructive sleep apnea (OSA) 2. Dysfunctions associated with sleep stages or arousal from sleep RECOMMENDATIONS:  1. This study demonstrates resolution of the patient's obstructive sleep apnea with CPAP therapy. I recommend a change from autoPAP to to CPAP of 14 cm via small fullface mask from ResMed, F20 with heated humidity. The patient will be advised to be fully compliant with PAP therapy to improve sleep related symptoms and decrease long term  cardiovascular risks. The patient should be reminded, that it may take up to 3 months to get fully used to using PAP with all planned sleep. The earlier full compliance is achieved, the better long term compliance tends to be. Please note that untreated obstructive sleep apnea carries additional perioperative morbidity. Patients with significant obstructive sleep apnea should receive perioperative PAP therapy and the surgeons and particularly the anesthesiologist should be informed of the diagnosis and the severity of the sleep disordered breathing. 2. This study shows sleep fragmentation and abnormal sleep stage percentages; these are nonspecific findings and per se do not signify an intrinsic sleep disorder or a cause for the patient's sleep-related symptoms. Causes include (but are not limited to) the first night effect of the sleep study, circadian rhythm disturbances, medication effect or an underlying mood disorder or medical problem.  3. The patient should be cautioned not to drive, work at heights, or operate dangerous or heavy equipment when tired or sleepy. Review and reiteration of good sleep hygiene measures should be pursued with any patient. 4. The patient will be seen in follow-up in the sleep clinic at [facility] for discussion of the test results, symptom and treatment compliance review, further management strategies, etc. The   referring provider will be notified of the test results. I certify that I have reviewed the entire raw data recording prior to the issuance of this report in accordance with the Standards of Accreditation of the American Academy of Sleep Medicine (AASM). Saima Athar, MD, PhD [facility] ([facility]) Diplomat, ABPN (Neurology and Sleep)               Technical Report:   [facility] CPAP Summary    General Information  Name: Watford, Rubena BMI: 46.52 Physician: Saima Athar, MD  ID: [id number] Height: 64.0 in  Technician: Sheena Fields, RPSGT  Sex: Female Weight: 271.0 lb Record: [id number]  Age: 46 [03/30/1975] Date: 01/03/2022     Medical & Medication History    46-year-old right-handed woman with history of hypertension, hyperlipidemia, migraine headaches, sciatica, cervical disc disease, asthma, carpal tunnel syndrome, diabetes, and severe obesity with a BMI of over 45, who presents for consultation of her obstructive sleep for sleep testing and starting AutoPap therapy. The patient is unaccompanied today. I first met her at the request of Dr. Chima on 05/30/2021, at which time she reported morning headaches, snoring and difficulty maintaining sleep. She was advised to proceed with a sleep study. She had a home sleep test on 06/15/2021 which indicated moderate obstructive sleep apnea with an AHI of 25.3/h, O2 nadir 74% with time below or at 88% saturation of over 1 hour. She was advised to proceed with AutoPap therapy. Her set up date was 06/29/21 and she has a ResMed air sense 11 AutoSet machine.  Today, 09/12/2021: I reviewed her AutoPap compliance data from 08/09/2021 through 09/07/2021, which is a total of 30 days, during which time she used her machine 27 days with percent use days greater than 4 hours at 80%, indicating very good compliance with an average usage of 5 hours and 58 minutes, residual AHI at goal at 0.4/h, average pressure for the 95th percentile at 12.4 cm with a range of 5 to 13 cm, leak acceptable with the 95th percentile at 14.2 L/min. She reports overall doing quite well with her AutoPap, she is struggling somewhat with her interface, she has a small F20 fullface mask and sometimes the leak bothers her and it blows into her eyes.  Calculated pAHI (per hour): 25.3/hour   REM pAHI: 51.4/hour   NREM pAHI: 10.8/hour the results from the overnight oximetry test showed. Average oxygen saturation was 90%, nadir was 63%. Time below or at 88% saturation was 1 hour and 49 minutes. The results from the  overnight oximetry test showed. Average oxygen saturation was 90%, nadir was 63%.  Time below or at 88% saturation was 1 hour and 49 minutes  Zyloprim, Lipitor, Zyrtec, Pepcid, Neurontin, Humalog, Lantus, Zestoretic, Glucophage, Lopressor, Singulair, Nurtec, Benadryl, Robaxin, Prilosec, Vitamin D   Sleep Disorder      Comments   The patient came into the sleep lab for a CPAP titration study with possible O2 added. The patient had an hst on 06/15/21 with our sleep lab. The patient was fitted with a ResMed F20 (FFM) size Small. The mask was a good fit to face, and the patient tolerated the interface well. CPAP was initiated at 9cmH2O with EPR of 2. This was the highest pressure that she could tolerate. The patient is on an auto-CPAP at home with a pressure of 5-13cmH2O. CPAP was increased to 14cmH2O with EPR of 2 due to respiratory events with desats. At this pressure O2 levels stayed above   90%. No snoring. Two restroom breaks. EKG kept in NSR. All sleep stages witnessed. Oxygen was not needed. The patient is wearing a nasal mask at home. I tried the mask in lab however, it started leaking. Mask was switched to an FFM. Slept mostly supine.     CPAP start time: 08:37:19 PM CPAP end time: 05:26:15 AM   Time Total Supine Side Prone Upright  Recording (TRT) 8h 49.0m 8h 34.5m 0h 14.5m 0h 0.0m 0h 0.0m  Sleep (TST) 6h 54.0m 6h 54.0m 0h 0.0m 0h 0.0m 0h 0.0m   Latency N1 N2 N3 REM Onset Per. Slp. Eff.  Actual 0h 0.0m 0h 1.0m 0h 12.0m 4h 9.0m 1h 1.5m 1h 1.5m 78.26%   Stg Dur Wake N1 N2 N3 REM  Total 115.0 6.0 266.5 99.5 42.0  Supine 100.5 6.0 266.5 99.5 42.0  Side 14.5 0.0 0.0 0.0 0.0  Prone 0.0 0.0 0.0 0.0 0.0  Upright 0.0 0.0 0.0 0.0 0.0   Stg % Wake N1 N2 N3 REM  Total 21.7 1.4 64.4 24.0 10.1  Supine 19.0 1.4 64.4 24.0 10.1  Side 2.7 0.0 0.0 0.0 0.0  Prone 0.0 0.0 0.0 0.0 0.0  Upright 0.0 0.0 0.0 0.0 0.0     Apnea Summary Sub Supine Side Prone Upright  Total 0 Total 0 0 0 0 0    REM 0 0 0 0 0     NREM 0 0 0 0 0  Obs 0 REM 0 0 0 0 0    NREM 0 0 0 0 0  Mix 0 REM 0 0 0 0 0    NREM 0 0 0 0 0  Cen 0 REM 0 0 0 0 0    NREM 0 0 0 0 0   Rera Summary Sub Supine Side Prone Upright  Total 0 Total 0 0 0 0 0    REM 0 0 0 0 0    NREM 0 0 0 0 0   Hypopnea Summary Sub Supine Side Prone Upright  Total 17 Total 17 17 0 0 0    REM 1 1 0 0 0    NREM 16 16 0 0 0   4% Hypopnea Summary Sub Supine Side Prone Upright  Total (4%) 17 Total 17 17 0 0 0    REM 1 1 0 0 0    NREM 16 16 0 0 0     AHI Total Obs Mix Cen  2.46 Apnea 0.00 0.00 0.00 0.00   Hypopnea 2.46 -- -- --  2.46 Hypopnea (4%) 2.46 -- -- --    Total Supine Side Prone Upright  Position AHI 2.46 2.46 0.00 0.00 0.00  REM AHI 1.43   NREM AHI 2.58   Position RDI 2.46 2.46 0.00 0.00 0.00  REM RDI 1.43   NREM RDI 2.58    4% Hypopnea Total Supine Side Prone Upright  Position AHI (4%) 2.46 2.46 0.00 0.00 0.00  REM AHI (4%) 1.43   NREM AHI (4%) 2.58   Position RDI (4%) 2.46 2.46 0.00 0.00 0.00  REM RDI (4%) 1.43   NREM RDI (4%) 2.58    Desaturation Information  <100% <90% <80% <70% <60% <50% <40%  Supine 31 15 0 0 0 0 0  Side 1 0 0 0 0 0 0  Prone 0 0 0 0 0 0 0  Upright 0 0 0 0 0 0 0  Total 32 15 0 0 0 0 0  Desaturation threshold setting: 3% Minimum   desaturation setting: 10 seconds SaO2 nadir: 84% The longest event was a 41 sec obstructive Hypopnea with a minimum SaO2 of 86%. The lowest SaO2 was 86% associated with a 41 sec obstructive Hypopnea. EKG Rates EKG Avg Max Min  Awake 98 115 86  Asleep 93 119 88  EKG Events: Tachycardia Awakening/Arousal Information # of Awakenings 14  Wake after sleep onset 53.5m  Wake after persistent sleep 53.5m   Arousal Assoc. Arousals Index  Apneas 0 0.0  Hypopneas 3 0.4  Leg Movements 0 0.0  Snore 0.0 0.0  PTT Arousals 0 0.0  Spontaneous 31 4.5  Total 34 4.9  Myoclonus Information PLMS LMs Index  Total LMs during PLMS 0 0.0  LMs w/ Microarousals 0 0.0   LM LMs Index  w/  Microarousal 0 0.0  w/ Awakening 0 0.0  w/ Resp Event 0 0.0  Spontaneous 1 0.1  Total 1 0.1      Titration Table:  [facility] CPAP/Bilevel Report    General Information  Name: Trachtenberg, Mirka BMI: 46 Physician: Athar, Saima  ID: [id number] Height: 64 in Technician: Fields, Sheena  Sex: Female Weight: 271 lb Record: [id number]  Age: 46 [09/23/1975] Date: 01/03/2022 Scorer: Sheena Fields   Recommended Settings IPAP: N/A cmH20 EPAP: N/A cmH2O AHI: N/A AHI (4%): N/A   Pressure IPAP/EPAP 00 09 10 12 13 14   O2 Vol 0.0 0.0 0.0 0.0 0.0 0.0  Time TRT 0.0m 160.0m 48.5m 74.5m 25.0m 221.0m   TST 0.0m 98.5m 21.5m 51.0m 25.0m 218.0m  Sleep Stage % Wake 0.0 38.4 55.7 31.5 0.0 1.4   % REM 0.0 0.0 0.0 0.0 0.0 19.3   % N1 0.0 1.5 4.7 5.9 0.0 0.2   % N2 0.0 35.5 95.3 94.1 40.0 70.2   % N3 0.0 62.9 0.0 0.0 60.0 10.3  Respiratory Total Events 0 2 4 3 4 4   Obs. Apn. 0 0 0 0 0 0   Mixed Apn. 0 0 0 0 0 0   Cen. Apn. 0 0 0 0 0 0   Hypopneas 0 2 4 3 4 4   AHI 0.00 1.22 11.16 3.53 9.60 1.10   Supine AHI 0.00 1.22 11.16 3.53 9.60 1.10   Prone AHI 0.00 0.00 0.00 0.00 0.00 0.00   Side AHI 0.00 0.00 0.00 0.00 0.00 0.00  Respiratory (4%) Hypopneas (4%) 0.00 2.00 4.00 3.00 4.00 4.00   AHI (4%) 0.00 1.22 11.16 3.53 9.60 1.10   Supine AHI (4%) 0.00 1.22 11.16 3.53 9.60 1.10   Prone AHI (4%) 0.00 0.00 0.00 0.00 0.00 0.00   Side AHI (4%) 0.00 0.00 0.00 0.00 0.00 0.00  Desat Profile <= 90% 0.0m 71.0m 22.1m 20.2m 1.3m 0.8m   <= 80% 0.0m 0.0m 13.2m 12.6m 0.0m 0.0m   <= 70% 0.0m 0.0m 13.2m 12.6m 0.0m 0.0m   <= 60% 0.0m 0.0m 13.2m 12.6m 0.0m 0.0m  Arousal Index Apnea 0.0 0.0 0.0 0.0 0.0 0.0   Hypopnea 0.0 0.6 2.8 0.0 0.0 0.3   LM 0.0 0.0 0.0 0.0 0.0 0.0   Spontaneous 0.0 4.3 11.2 7.1 7.2 3.0   

## 2022-01-12 ENCOUNTER — Telehealth: Payer: Self-pay

## 2022-01-16 ENCOUNTER — Telehealth: Payer: Self-pay

## 2022-01-16 NOTE — Telephone Encounter (Signed)
I called patient to discuss. No answer, left a message asking her to call me back. ?

## 2022-01-16 NOTE — Telephone Encounter (Signed)
-----   Message from Star Age, MD sent at 01/10/2022  6:52 PM EDT ----- Patient had a CPAP titration on 01/03/22 to optimize her treatment for sleep apnea. She has been on AutoPap therapy but has had oxygen desaturations while on AutoPap therapy.  I would like to change her home treatment from autoPAP to CPAP of 14 cm using a small FFM, which we used during the test. Please advise patient of the change and send the order. Arrange for a FU in sleep clinic with NP in about 3 mo. Thanks [employer]

## 2022-01-17 NOTE — Telephone Encounter (Signed)
Patient returned my call. I discussed her sleep study results and recommendations. I will send the order for CPAP to [facility]. She will let us know if she does not hear from them within in the next week. She will keep her appointment with Judson Roch, NP in January as scheduled. Pt verbalized understanding of results. Pt had no questions at this time but was encouraged to call back if questions arise.

## 2022-01-23 ENCOUNTER — Encounter: Payer: Self-pay | Admitting: Neurology

## 2022-01-23 DIAGNOSIS — G4733 Obstructive sleep apnea (adult) (pediatric): Secondary | ICD-10-CM

## 2022-01-23 NOTE — Telephone Encounter (Signed)
We can reduce set pressure to 12 cm. Order placed.

## 2022-01-27 ENCOUNTER — Other Ambulatory Visit: Payer: Self-pay

## 2022-01-27 ENCOUNTER — Encounter (HOSPITAL_COMMUNITY): Payer: Self-pay | Admitting: *Deleted

## 2022-01-27 ENCOUNTER — Emergency Department (HOSPITAL_COMMUNITY): Payer: 59

## 2022-01-27 ENCOUNTER — Emergency Department (HOSPITAL_COMMUNITY)
Admission: EM | Admit: 2022-01-27 | Discharge: 2022-01-27 | Disposition: A | Discharge status: Home / Self Care | Payer: 59 | Attending: Emergency Medicine | Admitting: Emergency Medicine

## 2022-01-27 DIAGNOSIS — I700 Atherosclerosis of aorta: Secondary | ICD-10-CM | POA: Diagnosis not present

## 2022-01-27 DIAGNOSIS — M79672 Pain in left foot: Secondary | ICD-10-CM | POA: Diagnosis present

## 2022-01-27 DIAGNOSIS — W109XXA Fall (on) (from) unspecified stairs and steps, initial encounter: Secondary | ICD-10-CM | POA: Diagnosis not present

## 2022-01-27 DIAGNOSIS — Y9301 Activity, walking, marching and hiking: Secondary | ICD-10-CM | POA: Insufficient documentation

## 2022-01-27 DIAGNOSIS — S92415A Nondisplaced fracture of proximal phalanx of left great toe, initial encounter for closed fracture: Secondary | ICD-10-CM | POA: Insufficient documentation

## 2022-01-27 DIAGNOSIS — W19XXXA Unspecified fall, initial encounter: Secondary | ICD-10-CM

## 2022-01-27 MED ORDER — OXYCODONE HCL 5 MG PO TABS: 5.0000 mg | ORAL_TABLET | ORAL | 0 refills | Status: DC | PRN

## 2022-01-27 MED ORDER — OXYCODONE HCL 5 MG PO TABS
5.0000 mg | ORAL_TABLET | Freq: Once | ORAL | Status: AC
  Administered 2022-01-27: 5 mg via ORAL
  Filled 2022-01-27: qty 1

## 2022-01-27 MED ORDER — LIDOCAINE 5 % EX PTCH
1.0000 | MEDICATED_PATCH | CUTANEOUS | Status: DC
  Administered 2022-01-27: 1 via TRANSDERMAL
  Filled 2022-01-27: qty 1

## 2022-01-27 NOTE — ED Provider Notes (Signed)
[facility] EMERGENCY DEPARTMENT Provider Note   CSN: [id number] Arrival date & time: 01/27/22  1606     History Chief Complaint  Patient presents with   Fall    HPI Lisa Kennedy is a 46 y.o. female presenting for fall.  Patient is a 46 year old female who was tripped by dog going downstairs.  She feels that her leg fell underneath her.  She has severe left foot pain.  Mild left ankle left knee pain.  She has been able to take a couple steps but is limited by pain.  She endorses multiple allergies and does not like taking pain medications but is willing to try one at this time.  She denies fevers chills, nausea vomiting, syncope or shortness of breath.  Patient otherwise ambulatory tolerating p.o. intake..   Patient's recorded medical, surgical, social, medication list and allergies were reviewed in the Snapshot window as part of the initial history.   Review of Systems   Review of Systems  Constitutional:  Negative for chills and fever.  HENT:  Negative for ear pain and sore throat.   Eyes:  Negative for pain and visual disturbance.  Respiratory:  Negative for cough and shortness of breath.   Cardiovascular:  Negative for chest pain and palpitations.  Gastrointestinal:  Negative for abdominal pain and vomiting.  Genitourinary:  Negative for dysuria and hematuria.  Musculoskeletal:  Negative for arthralgias and back pain.  Skin:  Negative for color change and rash.  Neurological:  Negative for seizures and syncope.  All other systems reviewed and are negative.   Physical Exam Updated Vital Signs BP 134/78   Pulse 81   Temp 98.5 F (36.9 C) (Oral)   Resp 18   Ht 5\' 4"  (1.626 m)   Wt 114.4 kg   SpO2 98%   BMI 43.29 kg/m  Physical Exam Vitals and nursing note reviewed.  Constitutional:      General: She is not in acute distress.    Appearance: She is well-developed.  HENT:     Head: Normocephalic and atraumatic.  Eyes:     Conjunctiva/sclera:  Conjunctivae normal.  Cardiovascular:     Rate and Rhythm: Normal rate and regular rhythm.     Heart sounds: No murmur heard. Pulmonary:     Effort: Pulmonary effort is normal. No respiratory distress.     Breath sounds: Normal breath sounds.  Abdominal:     General: There is no distension.     Palpations: Abdomen is soft.     Tenderness: There is no abdominal tenderness. There is no right CVA tenderness or left CVA tenderness.  Musculoskeletal:        General: Deformity (Obvious deformity to left foot great toe.  Is neurovascularly intact.  Grossly bruised.  No obvious penetration or skin tear.) present. No swelling or tenderness. Normal range of motion.     Cervical back: Neck supple.  Skin:    General: Skin is warm and dry.  Neurological:     General: No focal deficit present.     Mental Status: She is alert and oriented to person, place, and time. Mental status is at baseline.     Cranial Nerves: No cranial nerve deficit.      ED Course/ Medical Decision Making/ A&P    Procedures Procedures   Medications Ordered in ED Medications  lidocaine (LIDODERM) 5 % 1 patch (1 patch Transdermal Patch Applied 01/27/22 1952)  oxyCODONE (Oxy IR/ROXICODONE) immediate release tablet 5 mg (5 mg  Oral Given 01/27/22 1952)   Medical Decision Making:    [name] is a 46 y.o. female who presented to the ED today with a moderate mechanisma trauma, detailed above.    Given this mechanism of trauma, a full physical exam was performed. Notably, patient was HDS in NAD.   Reviewed and confirmed nursing documentation for past medical history, family history, social history.    Initial Assessment/Plan:   This is a patient presenting with a moderate mechanism trauma.  As such, I have considered intracranial injuries including intracranial hemorrhage, intrathoracic injuries including blunt myocardial or blunt lung injury, blunt abdominal injuries including aortic dissection, bladder injury, spleen  injury, liver injury and I have considered orthopedic injuries including extremity or spinal injury.  With the patient's presentation of moderate mechanism trauma but an otherwise reassuring exam, patient warrants targeted evaluation for potential traumatic injuries. Will proceed with targeted evaluation for potential injuries. Will proceed with targeted eval as below. CT Lumbar Spine Wo Contrast  Result Date: 01/27/2022 CLINICAL DATA:  fall; severe midline lumbar pain EXAM: CT LUMBAR SPINE WITHOUT CONTRAST TECHNIQUE: Multidetector CT imaging of the lumbar spine was performed without intravenous contrast administration. Multiplanar CT image reconstructions were also generated. RADIATION DOSE REDUCTION: This exam was performed according to the departmental dose-optimization program which includes automated exposure control, adjustment of the mA and/or kV according to patient size and/or use of iterative reconstruction technique. COMPARISON:  02/06/2020 FINDINGS: Segmentation: 5 lumbar type vertebrae. Alignment: Normal. Vertebrae: No acute fracture or focal pathologic process. Paraspinal and other soft tissues: 7 mm nonobstructing stone within the lower pole of the left kidney. Minimal aortoiliac atherosclerotic calcification. Disc levels: Intervertebral disc heights are preserved. Mild-to-moderate lower lumbar facet joint arthropathy, most pronounced at L4-5. No findings to suggest high-grade foraminal or canal stenosis by CT. IMPRESSION: 1. No acute fracture or traumatic malalignment of the lumbar spine. 2. Mild-to-moderate lower lumbar facet joint arthropathy, most pronounced at L4-5. 3. Nonobstructing 7 mm stone within the left kidney. 4. Aortic atherosclerosis (ICD10-I70.0). Electronically Signed   By: Davina Poke D.O.   On: 01/27/2022 19:01   DG Hip Unilat W or Wo Pelvis 2-3 Views Left  Result Date: 01/27/2022 CLINICAL DATA:  Fall EXAM: DG HIP (WITH OR WITHOUT PELVIS) 2-3V LEFT COMPARISON:  None  Available. FINDINGS: Pubic symphysis and rami appear intact. No fracture or malalignment. Joint spaces are patent IMPRESSION: Negative. Electronically Signed   By: Donavan Foil M.D.   On: 01/27/2022 18:33   DG Tibia/Fibula Left  Result Date: 01/27/2022 CLINICAL DATA:  Fall EXAM: LEFT TIBIA AND FIBULA - 2 VIEW COMPARISON:  None Available. FINDINGS: There is no evidence of fracture or other focal bone lesions. Soft tissues swelling is present. IMPRESSION: No acute osseous abnormality. Electronically Signed   By: Donavan Foil M.D.   On: 01/27/2022 18:33   DG Knee Complete 4 Views Left  Result Date: 01/27/2022 CLINICAL DATA:  Fall EXAM: LEFT KNEE - COMPLETE 4+ VIEW COMPARISON:  None Available. FINDINGS: No evidence of fracture, dislocation, or joint effusion. No evidence of arthropathy or other focal bone abnormality. Soft tissues are unremarkable. IMPRESSION: Negative. Electronically Signed   By: Donavan Foil M.D.   On: 01/27/2022 18:33   DG Foot Complete Left  Result Date: 01/27/2022 CLINICAL DATA:  Fall EXAM: LEFT FOOT - COMPLETE 3+ VIEW COMPARISON:  None Available. FINDINGS: Suspected acute nondisplaced, likely intra-articular fracture involving head and shaft of first proximal phalanx, best seen on lateral view. No subluxation.  No additional fracture is visualized. IMPRESSION: Acute nondisplaced intra-articular fracture involving head and shaft of first proximal phalanx. Electronically Signed   By: Jasmine Pang M.D.   On: 01/27/2022 18:32     Final Reassessment and Plan:   Fortunately, patient without any obvious fracture other than her left proximal great phalanx.  Will place patient in a fracture walking boot given her substantial pain with ambulation at baseline.  This has been exacerbated by her fall.  We will treat patient with pain medication emergency department and reassess her ambulation. On ambulation trial, she is successful with the cam boot.  After she left the emergency department,  I received a phone call from her that she was having worsening pain.  She is requesting prescription of pain medication starting tomorrow in case her symptoms get worse.  This is overall reasonable given her fractured phalanx.     Disposition:  I have considered need for hospitalization, however, patient's current presentation favors discharge at this time.  Patient/family educated about specific return precautions for given chief complaint and symptoms.  Patient/family educated about follow-up with PCP .     Patient/family expressed understanding of return precautions and need for follow-up. Patient spoken to regarding all imaging and laboratory results and appropriate follow up for these results. All education provided in verbal form with additional information in written form. Time was allowed for answering of patient questions. Patient discharged.    Emergency Department Medication Summary:   Medications  lidocaine (LIDODERM) 5 % 1 patch (1 patch Transdermal Patch Applied 01/27/22 1952)  oxyCODONE (Oxy IR/ROXICODONE) immediate release tablet 5 mg (5 mg Oral Given 01/27/22 1952)    Clinical Impression:  1. Fall, initial encounter      Discharge   Final Clinical Impression(s) / ED Diagnoses Final diagnoses:  Fall, initial encounter    Rx / DC Orders ED Discharge Orders          Ordered    oxyCODONE (ROXICODONE) 5 MG immediate release tablet  Every 4 hours PRN        01/27/22 2201              Glyn Ade, MD 01/27/22 2229

## 2022-01-27 NOTE — ED Triage Notes (Signed)
The pt was walking a dog when the dog caused her to trip and fall down some stairs  she is c/o her entire lt leg  is painful and when she fell down her lt leg went back under her  her lt foot is swollen  lmp none

## 2022-01-27 NOTE — ED Provider Triage Note (Signed)
Emergency Medicine Provider Triage Evaluation Note  Lisa Kennedy , a 46 y.o. female  was evaluated in triage.  Pt complains of tripping on her daughter's dog and landing on her back and L hip and knee. Reports ankle pain, leg pain, L hip pain and back pain immediately after falling. Has taken tylenol w/o relief. No head trauma, or blood thinners.   Review of Systems  Positive: L hip pain Negative: Loss of bowel, bladder  Physical Exam  BP (!) 142/81   Pulse 87   Temp 98.7 F (37.1 C) (Oral)   Resp 16   SpO2 96%  Gen:   Awake, no distress   Resp:  Normal effort  MSK:   Moves extremities without difficulty  Other:  TTP of midline lumbar spine, no stepoffs, ttp of midshaft of left tibia. TTP of medial and lateral malleolus of ankle. +Pulse present on L foot. TTP of Left hip. ROM intact, but painful.   Medical Decision Making  Medically screening exam initiated at 5:29 PM.  Appropriate orders placed.  Lisa Kennedy was informed that the remainder of the evaluation will be completed by another provider, this initial triage assessment does not replace that evaluation, and the importance of remaining in the ED until their evaluation is complete.    Osvaldo Shipper, [place] 01/27/22 1732

## 2022-01-27 NOTE — Discharge Instructions (Signed)
You are seen today for fall.  You have a left foot great toe fracture.  You also may have ligamentous sprain of the ankle or knee given your ongoing pain.  However there are no fractures identified.  We will place you in a walking boot for stability and recommend you follow close with your primary care provider.  Orthopedics may be required if you continue to have severe pain of the knee in particular.  Thank for the opportunity participate in your care, Tretha Sciara MD

## 2022-01-30 NOTE — Progress Notes (Signed)
Cardiology Office Note:    Date:  01/31/2022   ID:  Gwynneth Munson, DOB September 02, 1975, MRN [id number]  PCP:  Verlon Au, MD   [facility] Providers Cardiologist:  [name], MD     Referring MD: Verlon Au, MD   Chief Complaint  Patient presents with   New Patient (Initial Visit)  Palpitations  History of Present Illness:    Lisa Kennedy is a 46 y.o. female with a hx of DM2 A1c 7.5, HTN, HLD, asthma, BMI 43,referral form [facility] for palpitaitons. She's felt dizzy. Noted Hrs can get up to 130. Had recent mechanical fall was seen in the ED. She describes more persistent palpitations with tightening pain in the chest. Also describes a cough. Mother had stents, she was longstanding smoker.    She denies  syncope, family hx of SCD. She has no known structural heart dx  She does chair exercises and gets R sided chest pain. Last up to 5 minutes. Resting improves it.  Blood pressure is in good control  Past Medical History:  Diagnosis Date   Asthma    Blood transfusion without reported diagnosis 03/2008   During Hysterectomy    Carpal tunnel syndrome    Diabetes mellitus without complication (HCC)    type 2   Fluid collection of middle ear    Gout    Herniated disc, cervical    Lumbar as well   History of vitamin D deficiency    Hyperlipidemia    Hypertension    Irregular heartbeat    Migraine    Neuropathy    Sciatica     Past Surgical History:  Procedure Laterality Date   ABDOMINAL HYSTERECTOMY  2010   APPENDECTOMY  2010   CARPAL TUNNEL RELEASE  2008   CHOLECYSTECTOMY  1995   TRIGGER FINGER RELEASE     x 4   TUBAL LIGATION  1997    Current Medications: Current Meds  Medication Sig   albuterol (VENTOLIN HFA) 108 (90 Base) MCG/ACT inhaler Inhale into the lungs.   allopurinol (ZYLOPRIM) 100 MG tablet Take 100 mg by mouth daily.   atorvastatin (LIPITOR) 40 MG tablet Take 40 mg by mouth daily.   cetirizine  (ZYRTEC) 10 MG tablet Take 10 mg by mouth daily.   Cholecalciferol (VITAMIN D3) 1.25 MG (50000 UT) CAPS Take 1 capsule by mouth once a week.   famotidine (PEPCID) 40 MG tablet Take 40 mg by mouth 2 (two) times daily.   fluticasone-salmeterol (ADVAIR) 500-50 MCG/ACT AEPB Inhale 1 puff into the lungs 2 (two) times daily.   gabapentin (NEURONTIN) 300 MG capsule Take 300 mg by mouth 4 (four) times daily.   HUMALOG KWIKPEN 100 UNIT/ML KwikPen    insulin glargine (LANTUS) 100 UNIT/ML injection Inject 40 Units into the skin at bedtime.   lisinopril-hydrochlorothiazide (ZESTORETIC) 20-12.5 MG tablet Take 1 tablet by mouth 2 (two) times daily.   metFORMIN (GLUCOPHAGE) 1000 MG tablet Take 1,000 mg by mouth 2 (two) times daily with a meal.   metoprolol tartrate (LOPRESSOR) 100 MG tablet Take 1 tablet (100 mg total) by mouth once for 1 dose. PLEASE TAKE METOPROLOL 2  HOURS PRIOR TO CTA SCAN.   metoprolol tartrate (LOPRESSOR) 25 MG tablet Take 25 mg by mouth 2 (two) times daily.   montelukast (SINGULAIR) 10 MG tablet Take 10 mg by mouth daily.   oxyCODONE (ROXICODONE) 5 MG immediate release tablet Take 1 tablet (5 mg total) by mouth  every 4 (four) hours as needed for severe pain.   Rimegepant Sulfate (NURTEC) 75 MG TBDP Take 75 mg by mouth every other day.     Allergies:   Coconut (cocos nucifera), Imitrex [sumatriptan], Methocarbamol, Morphine and related, Penicillins, Toradol [ketorolac tromethamine], Ibuprofen, and Meloxicam   Social History   Socioeconomic History   Marital status: Legally Separated    Spouse name: Not on file   Number of children: 2   Years of education: Not on file   Highest education level: Associate degree: academic program  Occupational History   Not on file  Tobacco Use   Smoking status: Never   Smokeless tobacco: Never  Vaping Use   Vaping Use: Never used  Substance and Sexual Activity   Alcohol use: Yes    Comment: occassionally   Drug use: Never   Sexual  activity: Not Currently    Birth control/protection: Surgical  Other Topics Concern   Not on file  Social History Narrative   Left handed   Caffeine- 1-2 cups    Lives at home with daughter   Social Determinants of Health   Financial Resource Strain: Not on file  Food Insecurity: Not on file  Transportation Needs: No Transportation Needs (01/28/2019)   PRAPARE - Hydrologist (Medical): No    Lack of Transportation (Non-Medical): No  Physical Activity: Not on file  Stress: Not on file  Social Connections: Not on file     Family History: The patient's family history includes Asthma in her brother and sister; Breast cancer in her maternal grandmother; Colon cancer in her maternal grandfather; Diabetes in her maternal great-grandmother; Gout in her mother; Heart murmur in her mother; Hypertension in her mother; Hypokalemia in her mother; Kidney disease in her mother; Mental illness in her father; Sleep apnea in her daughter and paternal aunt; Stroke in her maternal grandmother.  ROS:   Please see the history of present illness.     All other systems reviewed and are negative.  EKGs/Labs/Other Studies Reviewed:    The following studies were reviewed today:   EKG:  EKG is  ordered today.  The ekg ordered today demonstrates   11/6- NSR  Recent Labs: No results found for requested labs within last 365 days.   Recent Lipid Panel No results found for: "CHOL", "TRIG", "HDL", "CHOLHDL", "VLDL", "LDLCALC", "LDLDIRECT"   Risk Assessment/Calculations:     Physical Exam:    VS:   Vitals:   01/31/22 0831  BP: 132/78  Pulse: 92  SpO2: 97%     Wt Readings from Last 3 Encounters:  01/31/22 253 lb (114.8 kg)  01/27/22 252 lb 3.3 oz (114.4 kg)  12/27/21 252 lb 2 oz (114.4 kg)     GEN:  Well nourished, well developed in no acute distress HEENT: Normal NECK: No JVD; No carotid bruits LYMPHATICS: No lymphadenopathy CARDIAC: RRR, no murmurs, rubs,  gallops RESPIRATORY:  Clear to auscultation without rales, wheezing or rhonchi  ABDOMEN: Soft, non-tender, non-distended MUSCULOSKELETAL:  No edema; No deformity  SKIN: Warm and dry NEUROLOGIC:  Alert and oriented x 3 PSYCHIATRIC:  Normal affect   ASSESSMENT:    Palpitations:She does not have high risk features including syncope c/f arrhythmia , family hx of SCD, or abnormalities on her EKG.  We discussed the importance of cutting caffeine.  CP: she has DM2, morbid obesity, and associates it with activity. Will assess for coronary dx  PLAN:    In order of problems  listed above:  3 day ziopatch Coronary CTA with 100 mg metop tartrate Follow up as needed           Medication Adjustments/Labs and Tests Ordered: Current medicines are reviewed at length with the patient today.  Concerns regarding medicines are outlined above.  Orders Placed This Encounter  Procedures   CT CORONARY MORPH W/CTA COR W/SCORE W/CA W/CM &/OR WO/CM   LONG TERM MONITOR (3-14 DAYS)   EKG 12-Lead   Meds ordered this encounter  Medications   metoprolol tartrate (LOPRESSOR) 100 MG tablet    Sig: Take 1 tablet (100 mg total) by mouth once for 1 dose. PLEASE TAKE METOPROLOL 2  HOURS PRIOR TO CTA SCAN.    Dispense:  1 tablet    Refill:  0    Patient Instructions  Medication Instructions:   **PLEASE TAKE METOPROLOL TARTRATE 100mg  TWO HOURS PRIOR TO CCTA SCAN**- OKAY TO HOLD MORNING DOSE OF METOPROLOL THE MORNING OF SCAN. ONLY TAKE 100mg  TWO HOURS PRIOR TO CCTA SCAN  *If you need a refill on your cardiac medications before your next appointment, please call your pharmacy*  Lab Work: BLOOD WORK TODAY  If you have labs (blood work) drawn today and your tests are completely normal, you will receive your results only by: MyChart Message (if you have MyChart) OR A paper copy in the mail If you have any lab test that is abnormal or we need to change your treatment, we will call you to review the  results.  Testing/Procedures: Your physician has requested that you have cardiac CT. Cardiac computed tomography (CT) is a painless test that uses an x-ray machine to take clear, detailed pictures of your heart. For further information please visit . Please follow instruction sheet as given.   ZIO XT- Long Term Monitor Instructions   Your physician has requested you wear your ZIO patch monitor__3_____days.   This is a single patch monitor.  [employer] supplies one patch monitor per enrollment.  Additional stickers are not available.   Please do not apply patch if you will be having a Nuclear Stress Test, Echocardiogram, Cardiac CT, MRI, or Chest Xray during the time frame you would be wearing the monitor. The patch cannot be worn during these tests.  You cannot remove and re-apply the ZIO XT patch monitor.   Your ZIO patch monitor will be sent USPS Priority mail from [facility] directly to your home address. The monitor may also be mailed to a PO BOX if home delivery is not available.   It may take 3-5 days to receive your monitor after you have been enrolled.   Once you have received you monitor, please review enclosed instructions.  Your monitor has already been registered assigning a specific monitor serial # to you.   Applying the monitor   Shave hair from upper left chest.   Hold abrader disc by orange tab.  Rub abrader in 40 strokes over left upper chest as indicated in your monitor instructions.   Clean area with 4 enclosed alcohol pads .  Use all pads to assure are is cleaned thoroughly.  Let dry.   Apply patch as indicated in monitor instructions.  Patch will be place under collarbone on left side of chest with arrow pointing upward.   Rub patch adhesive wings for 2 minutes.Remove white label marked "1".  Remove white label marked "2".  Rub patch adhesive wings for 2 additional minutes.   While looking in a mirror, press and release button  in center of  patch.  A small green light will flash 3-4 times .  This will be your only indicator the monitor has been turned on.     Do not shower for the first 24 hours.  You may shower after the first 24 hours.   Press button if you feel a symptom. You will hear a small click.  Record Date, Time and Symptom in the Patient Log Book.   When you are ready to remove patch, follow instructions on last 2 pages of Patient Log Book.  Stick patch monitor onto last page of Patient Log Book.   Place Patient Log Book in [place] box.  Use locking tab on box and tape box closed securely.  The Orange and [employer] has [employer] on it.  Please place in mailbox as soon as possible.  Your physician should have your test results approximately 7 days after the monitor has been mailed back to [facility].   Call [facility] Customer Care at [phone] if you have questions regarding your ZIO XT patch monitor.  Call them immediately if you see an orange light blinking on your monitor.   If your monitor falls off in less than 4 days contact our Monitor department at [phone].  If your monitor becomes loose or falls off after 4 days call [employer] at [phone] for suggestions on securing your monitor.    Follow-Up: At [facility], you and your health needs are our priority.  As part of our continuing mission to provide you with exceptional heart care, we have created designated Provider Care Teams.  These Care Teams include your primary Cardiologist (physician) and Advanced Practice Providers (APPs -  Physician Assistants and Nurse Practitioners) who all work together to provide you with the care you need, when you need it.  Your next appointment:   AS NEEDED   The format for your next appointment:   In Person  Provider:   [name], MD     Other Instructions   Your cardiac CT will be scheduled at one of the below locations:   [facility] [address] [place],  [place] [address] [phone]  If scheduled at [facility], please arrive at the [facility] and Children's Entrance (Entrance C2) of [facility] 30 minutes prior to test start time. You can use the FREE valet parking offered at entrance C (encouraged to control the heart rate for the test)  Proceed to the [facility] Radiology Department (first floor) to check-in and test prep.  All radiology patients and guests should use entrance C2 at [facility], accessed from [facility], even though the hospital's physical address listed is [address]     Please follow these instructions carefully (unless otherwise directed):  We will administer nitroglycerin during this exam.   On the Night Before the Test: Be sure to Drink plenty of water. Do not consume any caffeinated/decaffeinated beverages or chocolate 12 hours prior to your test. Do not take any antihistamines 12 hours prior to your test.  On the Day of the Test: Drink plenty of water until 1 hour prior to the test. Do not eat any food 1 hour prior to test. You may take your regular medications prior to the test.  Take metoprolol (Lopressor) two hours prior to test. HOLD Furosemide/Hydrochlorothiazide morning of the test. FEMALES- please wear underwire-free bra if available, avoid dresses & tight clothing      After  the Test: Drink plenty of water. After receiving IV contrast, you may experience a mild flushed feeling. This is normal. On occasion, you may experience a mild rash up to 24 hours after the test. This is not dangerous. If this occurs, you can take Benadryl 25 mg and increase your fluid intake. If you experience trouble breathing, this can be serious. If it is severe call 911 IMMEDIATELY. If it is mild, please call our office. If you take any of these medications: Glipizide/Metformin, Avandament, Glucavance, please do not take 48 hours after completing test unless otherwise  instructed.  We will call to schedule your test 2-4 weeks out understanding that some insurance companies will need an authorization prior to the service being performed.   For non-scheduling related questions, please contact the cardiac imaging nurse navigator should you have any questions/concerns: Rockwell AlexandriaSara Wallace, Cardiac Imaging Nurse Navigator Larey BrickMerle Prescott, Cardiac Imaging Nurse Navigator [facility] Direct Office Dial: [phone]   For scheduling needs, including cancellations and rescheduling, please call [place], [phone].          Signed, Maisie FusBranch, Bronsen Serano E, MD  01/31/2022 8:58 AM    [facility]

## 2022-01-31 ENCOUNTER — Ambulatory Visit (INDEPENDENT_AMBULATORY_CARE_PROVIDER_SITE_OTHER): Payer: 59

## 2022-01-31 ENCOUNTER — Encounter: Payer: Self-pay | Admitting: Internal Medicine

## 2022-01-31 ENCOUNTER — Ambulatory Visit: Payer: 59 | Attending: Internal Medicine | Admitting: Internal Medicine

## 2022-01-31 VITALS — BP 132/78 | HR 92 | Ht 64.0 in | Wt 253.0 lb

## 2022-01-31 DIAGNOSIS — R072 Precordial pain: Secondary | ICD-10-CM

## 2022-01-31 DIAGNOSIS — R002 Palpitations: Secondary | ICD-10-CM | POA: Diagnosis not present

## 2022-01-31 MED ORDER — METOPROLOL TARTRATE 100 MG PO TABS: 100.0000 mg | ORAL_TABLET | Freq: Once | ORAL | 0 refills | Status: DC

## 2022-01-31 NOTE — Patient Instructions (Signed)
Medication Instructions:   **PLEASE TAKE METOPROLOL TARTRATE 100mg  TWO HOURS PRIOR TO CCTA SCAN**- OKAY TO HOLD MORNING DOSE OF METOPROLOL THE MORNING OF SCAN. ONLY TAKE 100mg  TWO HOURS PRIOR TO CCTA SCAN  *If you need a refill on your cardiac medications before your next appointment, please call your pharmacy*  Lab Work: BLOOD WORK TODAY  If you have labs (blood work) drawn today and your tests are completely normal, you will receive your results only by: [place] (if you have MyChart) OR A paper copy in the mail If you have any lab test that is abnormal or we need to change your treatment, we will call you to review the results.  Testing/Procedures: Your physician has requested that you have cardiac CT. Cardiac computed tomography (CT) is a painless test that uses an x-ray machine to take clear, detailed pictures of your heart. For further information please visit [facility]. Please follow instruction sheet as given.   ZIO XT- Long Term Monitor Instructions   Your physician has requested you wear your ZIO patch monitor__3_____days.   This is a single patch monitor.  Irhythm supplies one patch monitor per enrollment.  Additional stickers are not available.   Please do not apply patch if you will be having a Nuclear Stress Test, Echocardiogram, Cardiac CT, MRI, or Chest Xray during the time frame you would be wearing the monitor. The patch cannot be worn during these tests.  You cannot remove and re-apply the ZIO XT patch monitor.   Your ZIO patch monitor will be sent USPS Priority mail from [facility] directly to your home address. The monitor may also be mailed to a PO BOX if home delivery is not available.   It may take 3-5 days to receive your monitor after you have been enrolled.   Once you have received you monitor, please review enclosed instructions.  Your monitor has already been registered assigning a specific monitor serial # to you.   Applying the  monitor   Shave hair from upper left chest.   Hold abrader disc by orange tab.  Rub abrader in 40 strokes over left upper chest as indicated in your monitor instructions.   Clean area with 4 enclosed alcohol pads .  Use all pads to assure are is cleaned thoroughly.  Let dry.   Apply patch as indicated in monitor instructions.  Patch will be place under collarbone on left side of chest with arrow pointing upward.   Rub patch adhesive wings for 2 minutes.Remove white label marked "1".  Remove white label marked "2".  Rub patch adhesive wings for 2 additional minutes.   While looking in a mirror, press and release button in center of patch.  A small green light will flash 3-4 times .  This will be your only indicator the monitor has been turned on.     Do not shower for the first 24 hours.  You may shower after the first 24 hours.   Press button if you feel a symptom. You will hear a small click.  Record Date, Time and Symptom in the Patient Log Book.   When you are ready to remove patch, follow instructions on last 2 pages of Patient Log Book.  Stick patch monitor onto last page of Patient Log Book.   Place Patient Log Book in [place] box.  Use locking tab on box and tape box closed securely.  The Orange and [employer] has [employer] on it.  Please place in mailbox  as soon as possible.  Your physician should have your test results approximately 7 days after the monitor has been mailed back to [facility].   Call [place] at [phone] if you have questions regarding your ZIO XT patch monitor.  Call them immediately if you see an orange light blinking on your monitor.   If your monitor falls off in less than 4 days contact our Monitor department at [phone].  If your monitor becomes loose or falls off after 4 days call [employer] at [phone] for suggestions on securing your monitor.    Follow-Up: At [facility], you and your health needs are our  priority.  As part of our continuing mission to provide you with exceptional heart care, we have created designated Provider Care Teams.  These Care Teams include your primary Cardiologist (physician) and Advanced Practice Providers (APPs -  Physician Assistants and Nurse Practitioners) who all work together to provide you with the care you need, when you need it.  Your next appointment:   AS NEEDED   The format for your next appointment:   In Person  Provider:   Janina Mayo, MD     Other Instructions   Your cardiac CT will be scheduled at one of the below locations:   [facility] [address] [place], [place] [address] [phone]  If scheduled at [facility], please arrive at the [facility] and Children's Entrance (Entrance C2) of [facility] 30 minutes prior to test start time. You can use the FREE valet parking offered at entrance C (encouraged to control the heart rate for the test)  Proceed to the [facility] Radiology Department (first floor) to check-in and test prep.  All radiology patients and guests should use entrance C2 at [facility], accessed from [facility], even though the hospital's physical address listed is [address]     Please follow these instructions carefully (unless otherwise directed):  We will administer nitroglycerin during this exam.   On the Night Before the Test: Be sure to Drink plenty of water. Do not consume any caffeinated/decaffeinated beverages or chocolate 12 hours prior to your test. Do not take any antihistamines 12 hours prior to your test.  On the Day of the Test: Drink plenty of water until 1 hour prior to the test. Do not eat any food 1 hour prior to test. You may take your regular medications prior to the test.  Take metoprolol (Lopressor) two hours prior to test. HOLD Furosemide/Hydrochlorothiazide morning of the test. FEMALES- please wear underwire-free bra if  available, avoid dresses & tight clothing      After the Test: Drink plenty of water. After receiving IV contrast, you may experience a mild flushed feeling. This is normal. On occasion, you may experience a mild rash up to 24 hours after the test. This is not dangerous. If this occurs, you can take Benadryl 25 mg and increase your fluid intake. If you experience trouble breathing, this can be serious. If it is severe call 911 IMMEDIATELY. If it is mild, please call our office. If you take any of these medications: Glipizide/Metformin, Avandament, Glucavance, please do not take 48 hours after completing test unless otherwise instructed.  We will call to schedule your test 2-4 weeks out understanding that some insurance companies will need an authorization prior to the service being performed.   For non-scheduling related questions, please contact the cardiac imaging nurse navigator should you  have any questions/concerns: Marchia Bond, Cardiac Imaging Nurse Navigator Gordy Clement, Cardiac Imaging Nurse Navigator [place] [facility] Direct Office Dial: [phone]   For scheduling needs, including cancellations and rescheduling, please call [place], [phone].

## 2022-01-31 NOTE — Progress Notes (Unsigned)
Enrolled for [employer] to mail a ZIO XT long term holter monitor to the patients address on file.  

## 2022-01-31 NOTE — Addendum Note (Signed)
Addended by: Rexanne Mano B on: 01/31/2022 09:00 AM   Modules accepted: Orders

## 2022-02-01 ENCOUNTER — Encounter: Payer: Self-pay | Admitting: Neurology

## 2022-02-01 LAB — BASIC METABOLIC PANEL
BUN/Creatinine Ratio: 11 (ref 9–23)
BUN: 9 mg/dL (ref 6–24)
CO2: 31 mmol/L — ABNORMAL HIGH (ref 20–29)
Calcium: 9.6 mg/dL (ref 8.7–10.2)
Chloride: 98 mmol/L (ref 96–106)
Creatinine, Ser: 0.83 mg/dL (ref 0.57–1.00)
Glucose: 187 mg/dL — ABNORMAL HIGH (ref 70–99)
Potassium: 4 mmol/L (ref 3.5–5.2)
Sodium: 141 mmol/L (ref 134–144)
eGFR: 87 mL/min/{1.73_m2} (ref >59)

## 2022-02-03 DIAGNOSIS — R072 Precordial pain: Secondary | ICD-10-CM

## 2022-02-03 DIAGNOSIS — R002 Palpitations: Secondary | ICD-10-CM | POA: Diagnosis not present

## 2022-02-13 ENCOUNTER — Telehealth (HOSPITAL_COMMUNITY): Payer: Self-pay | Admitting: *Deleted

## 2022-02-13 NOTE — Telephone Encounter (Signed)
Attempted to call patient regarding upcoming cardiac CT appointment. °Left message on voicemail with name and callback number ° °Quanda Pavlicek RN Navigator Cardiac Imaging °[facility] °[phone] Office °[phone] Cell ° °

## 2022-02-14 ENCOUNTER — Ambulatory Visit (HOSPITAL_COMMUNITY)
Admission: RE | Admit: 2022-02-14 | Discharge: 2022-02-14 | Disposition: A | Discharge status: Home / Self Care | Payer: 59 | Source: Ambulatory Visit | Attending: Internal Medicine | Admitting: Internal Medicine

## 2022-02-14 ENCOUNTER — Encounter (HOSPITAL_COMMUNITY): Payer: Self-pay

## 2022-02-14 DIAGNOSIS — R072 Precordial pain: Secondary | ICD-10-CM

## 2022-02-14 MED ORDER — DILTIAZEM HCL 25 MG/5ML IV SOLN: 5.0000 mg | INTRAVENOUS | Status: DC | PRN

## 2022-02-14 MED ORDER — METOPROLOL TARTRATE 5 MG/5ML IV SOLN
INTRAVENOUS | Status: AC
  Administered 2022-02-14: 10 mg via INTRAVENOUS
  Filled 2022-02-14: qty 10

## 2022-02-14 MED ORDER — METOPROLOL TARTRATE 5 MG/5ML IV SOLN: 5.0000 mg | INTRAVENOUS | Status: DC | PRN

## 2022-02-14 MED ORDER — NITROGLYCERIN 0.4 MG SL SUBL: 0.8000 mg | SUBLINGUAL_TABLET | Freq: Once | SUBLINGUAL | Status: DC

## 2022-02-14 MED ORDER — DILTIAZEM HCL 25 MG/5ML IV SOLN
INTRAVENOUS | Status: AC
  Administered 2022-02-14: 10 mg via INTRAVENOUS
  Filled 2022-02-14: qty 5

## 2022-02-14 NOTE — Progress Notes (Signed)
Pt's heart rate remains > 90s even after 10mg  of IV metoprolol and 10 mg of IV Cardizem given. Pt reports taking prescribed 100 mg of PO metoprolol prior to arrival as instructed. Dr. notified. Per MD cancel test at this time. CT technologist and [place] CT heart navigator notified. Pt informed of plan to cancel and she verbalized understanding. IV removed and pt discharged home.

## 2022-02-15 ENCOUNTER — Telehealth: Payer: Self-pay

## 2022-02-15 DIAGNOSIS — R072 Precordial pain: Secondary | ICD-10-CM

## 2022-02-15 NOTE — Addendum Note (Signed)
Addended by: Bea Laura B on: 02/15/2022 02:38 PM   Modules accepted: Orders

## 2022-02-15 NOTE — Addendum Note (Signed)
Addended byCarolan Clines on: 02/15/2022 02:54 PM   Modules accepted: Orders

## 2022-02-15 NOTE — Telephone Encounter (Addendum)
Returned call to patient and made her aware of Dr. Verna Czech recommendations. Lexiscan Stress Test ordered. Consent entered and sent to provider to sign.   Advised patient to call back to office with any issues, questions, or concerns. Patient verbalized understanding.

## 2022-02-15 NOTE — Telephone Encounter (Signed)
Attempted to call patient, left message for patient to call back to office.   

## 2022-02-15 NOTE — Telephone Encounter (Signed)
-----   Message from [name], MD sent at 02/15/2022  8:21 AM EST ----- Regarding: SPECT Hi Lisa Kennedy,  Please schedule her for a lexiscan SPECT. Her HR was too fast for the CT scan.

## 2022-02-22 ENCOUNTER — Encounter (HOSPITAL_COMMUNITY): Payer: Self-pay | Admitting: Internal Medicine

## 2022-02-24 ENCOUNTER — Ambulatory Visit (HOSPITAL_COMMUNITY): Payer: 59 | Attending: Internal Medicine

## 2022-02-24 DIAGNOSIS — R072 Precordial pain: Secondary | ICD-10-CM

## 2022-02-24 LAB — MYOCARDIAL PERFUSION IMAGING
Base ST Depression (mm): 0 mm
LV dias vol: 145 mL (ref 46–106)
LV sys vol: 71 mL
Nuc Stress EF: 77 %
Peak HR: 118 {beats}/min
Rest HR: 89 {beats}/min
Rest Nuclear Isotope Dose: 10.2 mCi
SDS: 0
SRS: 0
SSS: 0
ST Depression (mm): 0 mm
Stress Nuclear Isotope Dose: 32.9 mCi
TID: 0.92

## 2022-02-24 MED ORDER — TECHNETIUM TC 99M TETROFOSMIN IV KIT
32.9000 | PACK | Freq: Once | INTRAVENOUS | Status: AC | PRN
  Administered 2022-02-24: 32.9 via INTRAVENOUS

## 2022-02-24 MED ORDER — REGADENOSON 0.4 MG/5ML IV SOLN
0.4000 mg | Freq: Once | INTRAVENOUS | Status: AC
  Administered 2022-02-24: 0.4 mg via INTRAVENOUS

## 2022-02-24 MED ORDER — TECHNETIUM TC 99M TETROFOSMIN IV KIT
10.2000 | PACK | Freq: Once | INTRAVENOUS | Status: AC | PRN
  Administered 2022-02-24: 10.2 via INTRAVENOUS

## 2022-03-20 ENCOUNTER — Encounter: Payer: Self-pay | Admitting: Neurology

## 2022-04-04 ENCOUNTER — Encounter: Payer: Self-pay | Admitting: Neurology

## 2022-04-04 DIAGNOSIS — Z789 Other specified health status: Secondary | ICD-10-CM

## 2022-04-04 DIAGNOSIS — G4733 Obstructive sleep apnea (adult) (pediatric): Secondary | ICD-10-CM

## 2022-04-04 NOTE — Telephone Encounter (Signed)
Order place to reduce CPAP to 12 cm.

## 2022-04-05 NOTE — Telephone Encounter (Signed)
New, Willodean Rosenthal, RN; Alonna Minium; Minus Liberty; Nash Shearer Received, Thank you!     Previous Messages    ----- Message ----- From: Brandon Melnick, RN Sent: 04/04/2022   2:48 PM EST To: Darlina Guys; Miquel Dunn; Nash Shearer; * Subject: pressure change cpap                          Lisa Kennedy, Lisa Kennedy Female, 47 y.o., 1975/08/10 Last BMI: 43.4 MRN: [id number]   New order in EPIC  I have changed in [place]  is pended.    Acupuncturist

## 2022-04-11 ENCOUNTER — Ambulatory Visit: Payer: 59 | Admitting: Neurology

## 2022-05-02 ENCOUNTER — Other Ambulatory Visit: Payer: Self-pay | Admitting: Family

## 2022-05-02 DIAGNOSIS — Z1231 Encounter for screening mammogram for malignant neoplasm of breast: Secondary | ICD-10-CM

## 2022-05-21 ENCOUNTER — Other Ambulatory Visit: Payer: Self-pay | Admitting: Internal Medicine

## 2022-06-12 ENCOUNTER — Encounter: Payer: Self-pay | Admitting: Neurology

## 2022-06-17 ENCOUNTER — Encounter: Payer: Self-pay | Admitting: Psychiatry

## 2022-06-19 NOTE — Telephone Encounter (Signed)
Initiated PA [employer]ur[employer] on covermymeds. Key: [id number]. In process of completing

## 2022-06-19 NOTE — Telephone Encounter (Signed)
Submitted PA. Waiting on determination from [facility]. 

## 2022-06-28 ENCOUNTER — Ambulatory Visit
Admission: RE | Admit: 2022-06-28 | Discharge: 2022-06-28 | Disposition: A | Discharge status: Home / Self Care | Payer: 59 | Source: Ambulatory Visit | Attending: Family | Admitting: Family

## 2022-06-28 DIAGNOSIS — Z1231 Encounter for screening mammogram for malignant neoplasm of breast: Secondary | ICD-10-CM

## 2022-07-03 ENCOUNTER — Other Ambulatory Visit: Payer: Self-pay | Admitting: Family

## 2022-07-03 DIAGNOSIS — N6311 Unspecified lump in the right breast, upper outer quadrant: Secondary | ICD-10-CM

## 2022-07-17 ENCOUNTER — Ambulatory Visit
Admission: RE | Admit: 2022-07-17 | Discharge: 2022-07-17 | Disposition: A | Discharge status: Home / Self Care | Payer: 59 | Source: Ambulatory Visit | Attending: Family | Admitting: Family

## 2022-07-17 DIAGNOSIS — N6311 Unspecified lump in the right breast, upper outer quadrant: Secondary | ICD-10-CM

## 2022-09-12 NOTE — Progress Notes (Signed)
Patient: Lisa Kennedy Date of Birth: [birth year]  Reason for Visit: Follow up History from: Patient Primary Neurologist: [facility]-Headaches; [facility]-OSA  ASSESSMENT AND PLAN 47 y.o. year old female   1.  OSA on CPAP -Encouraged to wear nightly, for a minimum of 4 hours, her usage is slightly suboptimal, but she is motivated to continue.  Advised her to apply the CPAP when she gets into bed, preventing her from falling asleep without wearing.  We will continue current settings and current mask.  She will ensure the mask is tight enough to prevent air leak.  Hopeful that her ESS will continue to improve with superb CPAP compliance.  We will revisit this in 6 months.  2.  Chronic migraine headaches -Under good control, no more than 61-month, excellent benefit with CPAP usage -Reorder Nurtec 75 mg every other day as migraine preventative, if insurance will not approve, can use as needed -Will complete FMLA for work  HISTORY OF PRESENT ILLNESS: Today 09/13/22 She saw Dr. Delena Bali October 2023 started Nurtec every other day for migraine prevention.  She follows with Dr. Frances Furbish for her CPAP.  They had an office visit in June 2023.  Had HST March 2023 showing moderate OSA her set up date was June 29, 2021.  She had O2 nadir of 74%.  Had overnight pulse oximetry June 2023 time below 88% was 1 hour 49 minutes.  Recommended she come in for proper titration study.  Study was completed 01/03/2022 due to oxygen desaturations on AutoPap.  Her CPAP was switched to set pressure 14 cm.  Pressure was reduced to 12 cmH2O for comfort.   She mentioned the Nurtec every other day was too expensive at the beginning of the year due to being $900 for her high deductible plan, may be less since has been paying on her deductible. Works for [employer] at home. Since starting on CPAP, migraines much improved, no more than 4 migraines a month, Nurtec works very well, takes 20 minutes. In the last 30 days, has clocked out  of work early 2 afternoons due to migraine but didn't get an occurrence.  Has FMLA paperwork for [place] to complete.  CPAP data 08/14/2022-09/12/22 showed usage 26/30 days at 87%, greater than 4 hours 20 days at 67%.  Average usage days used 5 hours 3 minutes.  Set pressure 12, EPR 3.  Leak 34.7.  AHI 0.3.  Using fullface mask.  A few nights she traveled and did not wear it.  Sometimes she will fall asleep without putting it on.  Occasionally notes the mask leaking if it gets malposition.  She tolerates the CPAP well.  Pressure is tolerable.  She is very pleased with benefit to migraines.  ESS remains elevated at 15.  HISTORY  12/27/21 Dr. Delena Bali: 47 year old female with a history of recurrent sinusitis, HLD, DM, kidney stones, OSA on autoPAP who follows in clinic for headaches.   At her last visit she was started on Nurtec for migraine rescue and referred to Sleep for OSA evaluation.   Interval History: She was found to have moderate OSA and was started on autoPAP. Headache severity has improved with use of the autoPAP, though she does continue to have 1-2 migraines per week. Notes that her migraines are typically worse around this time of year. Nurtec does help reduce her headache when she takes it for rescue. She tolerates this well without side effects.   Headache days per month: 8 Headache free days per  month: 22   Current Headache Regimen: Preventative: none Abortive: Nurtec 75 mg PRN     Prior Therapies                                  Imitrex - jittery, hives Nurtec 75 mg PRN Fioricet Tylenol Robaxin Gabapentin 300 mg four times a day Metoprolol 25 mg BID Topamax contraindicated due to kidney stones  REVIEW OF SYSTEMS: Out of a complete 14 system review of symptoms, the patient complains only of the following symptoms, and all other reviewed systems are negative.  See HPI  ALLERGIES: Allergies  Allergen Reactions   Coconut (Cocos Nucifera) Hives   Coconut Oil Rash   Coconut  Fatty Acids    Ketorolac    Methocarbamol Other (See Comments)    Upset stomach    Morphine Hives   Morphine And Codeine Hives   Morphine Sulfate    Toradol [Ketorolac Tromethamine] Hives    Tingling in tongue   Ibuprofen     Other reaction(s): GI Upset (intolerance)  Other Reaction(s): GI intolerance, GI Intolerance   Meloxicam     Other reaction(s): GI Upset (intolerance)  Other Reaction(s): GI Intolerance   Penicillins Hives and Rash    Did it involve swelling of the face/tongue/throat, SOB, or low BP? No  Did it involve sudden or severe rash/hives, skin peeling, or any reaction on the inside of your mouth or nose? Yes  Did you need to seek medical attention at a hospital or doctor's office? No  When did it last happen?     more than 5 yrs ago   If all above answers are "NO", may proceed with cephalosporin use.  Other reaction(s): Hives, Did it involve swelling of the face/tongue/throat, SOB, or low BP? No, Did it involve sudden or severe rash/hives, skin peeling, or any reaction on the inside of your mouth or nose? Yes, Did you need to seek medical attention at a hospital or doctor's office? No, When did it last happen?     more than 5 yrs ago , If all above answers are "NO", may proceed with cephalosporin use.   Sumatriptan Hives and Rash    HOME MEDICATIONS: Outpatient Medications Prior to Visit  Medication Sig Dispense Refill   albuterol (VENTOLIN HFA) 108 (90 Base) MCG/ACT inhaler Inhale into the lungs.     allopurinol (ZYLOPRIM) 100 MG tablet Take 100 mg by mouth daily.     atorvastatin (LIPITOR) 40 MG tablet Take 40 mg by mouth daily.     cetirizine (ZYRTEC) 10 MG tablet Take 10 mg by mouth daily.     Cholecalciferol (VITAMIN D3) 1.25 MG (50000 UT) CAPS Take 1 capsule by mouth once a week.     famotidine (PEPCID) 40 MG tablet Take 40 mg by mouth 2 (two) times daily.     gabapentin (NEURONTIN) 300 MG capsule Take 300 mg by mouth 4 (four) times daily.     HUMALOG  KWIKPEN 100 UNIT/ML KwikPen      insulin glargine (LANTUS) 100 UNIT/ML injection Inject 40 Units into the skin at bedtime.     lisinopril-hydrochlorothiazide (ZESTORETIC) 20-12.5 MG tablet Take 1 tablet by mouth 2 (two) times daily.     metFORMIN (GLUCOPHAGE) 1000 MG tablet Take 1,000 mg by mouth 2 (two) times daily with a meal.     metoprolol tartrate (LOPRESSOR) 25 MG tablet Take 25 mg by mouth 2 (two) times  daily.     montelukast (SINGULAIR) 10 MG tablet Take 10 mg by mouth daily.     SYMBICORT 160-4.5 MCG/ACT inhaler Inhale 2 puffs into the lungs 2 (two) times daily.     Rimegepant Sulfate (NURTEC) 75 MG TBDP Take 75 mg by mouth every other day. 16 tablet 6   fluticasone-salmeterol (ADVAIR) 500-50 MCG/ACT AEPB Inhale 1 puff into the lungs 2 (two) times daily.     metoprolol tartrate (LOPRESSOR) 100 MG tablet Take 1 tablet (100 mg total) by mouth once for 1 dose. PLEASE TAKE METOPROLOL 2  HOURS PRIOR TO CTA SCAN. (Patient not taking: Reported on 09/13/2022) 1 tablet 0   oxyCODONE (ROXICODONE) 5 MG immediate release tablet Take 1 tablet (5 mg total) by mouth every 4 (four) hours as needed for severe pain. 10 tablet 0   VICTOZA 18 MG/3ML SOPN Inject into the skin. (Patient not taking: Reported on 01/31/2022)     No facility-administered medications prior to visit.    PAST MEDICAL HISTORY: Past Medical History:  Diagnosis Date   Asthma    Blood transfusion without reported diagnosis 03/2008   During Hysterectomy    Carpal tunnel syndrome    Diabetes mellitus without complication (HCC)    type 2   Fluid collection of middle ear    Gout    Herniated disc, cervical    Lumbar as well   History of vitamin D deficiency    Hyperlipidemia    Hypertension    Irregular heartbeat    Migraine    Neuropathy    Sciatica     PAST SURGICAL HISTORY: Past Surgical History:  Procedure Laterality Date   ABDOMINAL HYSTERECTOMY  2010   APPENDECTOMY  2010   CARPAL TUNNEL RELEASE  2008    CHOLECYSTECTOMY  1995   TRIGGER FINGER RELEASE     x 4   TUBAL LIGATION  1997    FAMILY HISTORY: Family History  Problem Relation Age of Onset   Hypertension Mother    Hypokalemia Mother    Gout Mother    Kidney disease Mother    Heart murmur Mother    Mental illness Father    Asthma Sister    Asthma Brother    Sleep apnea Paternal Aunt    Stroke Maternal Grandmother    Breast cancer Maternal Grandmother    Colon cancer Maternal Grandfather    Sleep apnea Daughter    Diabetes Maternal Great-grandmother     SOCIAL HISTORY: Social History   Socioeconomic History   Marital status: Legally Separated    Spouse name: Not on file   Number of children: 2   Years of education: Not on file   Highest education level: Associate degree: academic program  Occupational History   Not on file  Tobacco Use   Smoking status: Never   Smokeless tobacco: Never  Vaping Use   Vaping Use: Never used  Substance and Sexual Activity   Alcohol use: Not Currently    Comment: occassionally   Drug use: Never   Sexual activity: Not Currently    Birth control/protection: Surgical  Other Topics Concern   Not on file  Social History Narrative   Left handed   Caffeine- 1-2 cups    Lives at home with daughter   Social Determinants of Health   Financial Resource Strain: Not on file  Food Insecurity: Not on file  Transportation Needs: No Transportation Needs (01/28/2019)   PRAPARE - Administrator, Civil Service (Medical):  No    Lack of Transportation (Non-Medical): No  Physical Activity: Not on file  Stress: Not on file  Social Connections: Not on file  Intimate Partner Violence: Not on file    PHYSICAL EXAM  Vitals:   09/13/22 0949  BP: 125/83  Pulse: (!) 111  Weight: 265 lb 8 oz (120.4 kg)  Height: 5' 4.5" (1.638 m)   Body mass index is 44.87 kg/m.  Generalized: Well developed, in no acute distress  Neurological examination  Mentation: Alert oriented to time,  place, history taking. Follows all commands speech and language fluent Cranial nerve II-XII: Pupils were equal round reactive to light. Extraocular movements were full, visual field were full on confrontational test. Facial sensation and strength were normal. Head turning and shoulder shrug  were normal and symmetric. Motor: The motor testing reveals 5 over 5 strength of all 4 extremities. Good symmetric motor tone is noted throughout.  Sensory: Sensory testing is intact to soft touch on all 4 extremities. No evidence of extinction is noted.  Coordination: Cerebellar testing reveals good finger-nose-finger and heel-to-shin bilaterally.  Gait and station: Gait is normal.  Reflexes: Deep tendon reflexes are symmetric and normal bilaterally.   DIAGNOSTIC DATA (LABS, IMAGING, TESTING) - I reviewed patient records, labs, notes, testing and imaging myself where available.  Lab Results  Component Value Date   WBC 9.4 02/06/2020   HGB 13.6 02/06/2020   HCT 43.5 02/06/2020   MCV 86.1 02/06/2020   PLT 397 02/06/2020      Component Value Date/Time   NA 141 01/31/2022 0911   K 4.0 01/31/2022 0911   CL 98 01/31/2022 0911   CO2 31 (H) 01/31/2022 0911   GLUCOSE 187 (H) 01/31/2022 0911   GLUCOSE 117 (H) 02/06/2020 1350   BUN 9 01/31/2022 0911   CREATININE 0.83 01/31/2022 0911   CALCIUM 9.6 01/31/2022 0911   PROT 7.5 02/06/2020 1350   ALBUMIN 3.7 02/06/2020 1350   AST 18 02/06/2020 1350   ALT 15 02/06/2020 1350   ALKPHOS 88 02/06/2020 1350   BILITOT 0.8 02/06/2020 1350   GFRNONAA 46 (L) 02/06/2020 1350   GFRAA >60 09/22/2019 2023   No results found for: "CHOL", "HDL", "LDLCALC", "LDLDIRECT", "TRIG", "CHOLHDL" No results found for: "HGBA1C" No results found for: "VITAMINB12" No results found for: "TSH"  Margie Ege, AGNP-C, DNP 09/13/2022, 10:15 AM [facility] [address], [address] [place], [place] [address] [phone]

## 2022-09-13 ENCOUNTER — Encounter: Payer: Self-pay | Admitting: Neurology

## 2022-09-13 ENCOUNTER — Ambulatory Visit (INDEPENDENT_AMBULATORY_CARE_PROVIDER_SITE_OTHER): Payer: 59 | Admitting: Neurology

## 2022-09-13 ENCOUNTER — Telehealth: Payer: Self-pay

## 2022-09-13 VITALS — BP 125/83 | HR 111 | Ht 64.5 in | Wt 265.5 lb

## 2022-09-13 DIAGNOSIS — G43709 Chronic migraine without aura, not intractable, without status migrainosus: Secondary | ICD-10-CM

## 2022-09-13 DIAGNOSIS — G4733 Obstructive sleep apnea (adult) (pediatric): Secondary | ICD-10-CM | POA: Diagnosis not present

## 2022-09-13 MED ORDER — NURTEC 75 MG PO TBDP: 75.0000 mg | ORAL_TABLET | ORAL | 6 refills | Status: DC

## 2022-09-13 NOTE — Telephone Encounter (Signed)
Community message sent to [employer]. 

## 2022-09-13 NOTE — Telephone Encounter (Signed)
Completed paperwork and placed in NP office for review and signature

## 2022-09-13 NOTE — Patient Instructions (Signed)
Continue using CPAP, use nightly for a minimum of 4 hours  Continue nurtec, will refill every other day for migraine prevention, if headaches are not frequent can take as needed See you back in 6 months

## 2022-09-14 DIAGNOSIS — Z0289 Encounter for other administrative examinations: Secondary | ICD-10-CM

## 2022-10-19 ENCOUNTER — Ambulatory Visit: Payer: 59 | Admitting: Neurology

## 2022-12-14 ENCOUNTER — Telehealth: Payer: Self-pay

## 2022-12-14 NOTE — Telephone Encounter (Signed)
*  [facility]  Pharmacy Patient Advocate Encounter   Received notification from CoverMyMeds that prior authorization for Nurtec 75MG  dispersible tablets  is required/requested.   Insurance verification completed.   The patient is insured through [facility] .   Per test claim: PA required; PA submitted to [facility] via CoverMyMeds Key/confirmation #/EOC [facility] Status is pending

## 2022-12-20 ENCOUNTER — Other Ambulatory Visit (HOSPITAL_COMMUNITY): Payer: Self-pay

## 2022-12-20 NOTE — Telephone Encounter (Signed)
Pharmacy Patient Advocate Encounter  Received notification from [facility] that Prior Authorization for Nurtec 75MG  dispersible tablets has been APPROVED from 12/18/2022 to 12/14/2023   PA #/Case ID/Reference #: PA Case ID #: [id number]

## 2023-01-10 ENCOUNTER — Ambulatory Visit (HOSPITAL_COMMUNITY)
Admission: EM | Admit: 2023-01-10 | Discharge: 2023-01-10 | Disposition: A | Discharge status: Home / Self Care | Payer: 59 | Attending: Physician Assistant | Admitting: Physician Assistant

## 2023-01-10 ENCOUNTER — Encounter (HOSPITAL_COMMUNITY): Payer: Self-pay | Admitting: Emergency Medicine

## 2023-01-10 ENCOUNTER — Ambulatory Visit (HOSPITAL_COMMUNITY): Payer: 59

## 2023-01-10 DIAGNOSIS — S9032XA Contusion of left foot, initial encounter: Secondary | ICD-10-CM

## 2023-01-10 NOTE — Discharge Instructions (Addendum)
Return if any problems.   Tylenol for pain

## 2023-01-10 NOTE — ED Provider Notes (Incomplete Revision)
[facility]    CSN: [id number] Arrival date & time: 01/10/23  1748      History   Chief Complaint Chief Complaint  Patient presents with   Foot Pain    HPI Lisa Kennedy is a 47 y.o. female.   Patient complains of pain in her left foot and her left ankle.  Patient reports she was walking on the treadmill and slipped and fell.  Patient reports that she struck the side of her foot.  Patient was unaware that she injured her ankle but today her ankle is swollen and tender.  Patient complains of difficulty walking.  Patient reports she previously injured her same foot.  Patient reports she had a fracture near her little toe  The history is provided by the patient. No language interpreter was used.  Foot Pain    Past Medical History:  Diagnosis Date   Asthma    Blood transfusion without reported diagnosis 03/2008   During Hysterectomy    Carpal tunnel syndrome    Diabetes mellitus without complication (HCC)    type 2   Fluid collection of middle ear    Gout    Herniated disc, cervical    Lumbar as well   History of vitamin D deficiency    Hyperlipidemia    Hypertension    Irregular heartbeat    Migraine    Neuropathy    Sciatica     Patient Active Problem List   Diagnosis Date Noted   Chronic migraine w/o aura, not intractable, w/o stat migr 09/13/2022   OSA on CPAP 09/13/2022   Screening breast examination 01/28/2019   Breast pain 01/28/2019    Past Surgical History:  Procedure Laterality Date   ABDOMINAL HYSTERECTOMY  2010   APPENDECTOMY  2010   CARPAL TUNNEL RELEASE  2008   CHOLECYSTECTOMY  1995   TRIGGER FINGER RELEASE     x 4   TUBAL LIGATION  1997    OB History     Gravida  2   Para      Term      Preterm      AB      Living         SAB      IAB      Ectopic      Multiple      Live Births  2            Home Medications    Prior to Admission medications   Medication Sig Start Date End Date Taking?  Authorizing Provider  albuterol (VENTOLIN HFA) 108 (90 Base) MCG/ACT inhaler Inhale into the lungs. 02/24/21   [provider]  allopurinol (ZYLOPRIM) 100 MG tablet Take 100 mg by mouth daily.    [provider]  atorvastatin (LIPITOR) 40 MG tablet Take 40 mg by mouth daily.    [provider]  cetirizine (ZYRTEC) 10 MG tablet Take 10 mg by mouth daily.    [provider]  Cholecalciferol (VITAMIN D3) 1.25 MG (50000 UT) CAPS Take 1 capsule by mouth once a week. 10/01/21   [provider]  famotidine (PEPCID) 40 MG tablet Take 40 mg by mouth 2 (two) times daily.    [provider]  gabapentin (NEURONTIN) 300 MG capsule Take 300 mg by mouth 4 (four) times daily.    [provider]  HUMALOG KWIKPEN 100 UNIT/ML KwikPen  12/24/20   [provider]  insulin glargine (LANTUS) 100 UNIT/ML injection  Inject 40 Units into the skin at bedtime.    [provider]  lisinopril-hydrochlorothiazide (ZESTORETIC) 20-12.5 MG tablet Take 1 tablet by mouth 2 (two) times daily.    [provider]  metFORMIN (GLUCOPHAGE) 1000 MG tablet Take 1,000 mg by mouth 2 (two) times daily with a meal.    [provider]  metoprolol tartrate (LOPRESSOR) 25 MG tablet Take 25 mg by mouth 2 (two) times daily.    [provider]  montelukast (SINGULAIR) 10 MG tablet Take 10 mg by mouth daily.    [provider]  Rimegepant Sulfate (NURTEC) 75 MG TBDP Take 1 tablet (75 mg total) by mouth every other day. 09/13/22   Glean Salvo, NP  SYMBICORT 160-4.5 MCG/ACT inhaler Inhale 2 puffs into the lungs 2 (two) times daily.    [provider]    Family History Family History  Problem Relation Age of Onset   Hypertension Mother    Hypokalemia Mother    Gout Mother    Kidney disease Mother    Heart murmur Mother    Mental illness Father    Asthma Sister    Asthma Brother    Sleep apnea Paternal Aunt    Stroke  Maternal Grandmother    Breast cancer Maternal Grandmother    Colon cancer Maternal Grandfather    Sleep apnea Daughter    Diabetes Maternal Great-grandmother     Social History Social History   Tobacco Use   Smoking status: Never   Smokeless tobacco: Never  Vaping Use   Vaping status: Never Used  Substance Use Topics   Alcohol use: Not Currently    Comment: occassionally   Drug use: Never     Allergies   Coconut (cocos nucifera), Coconut oil, Coconut fatty acid, Ketorolac, Methocarbamol, Morphine, Morphine and codeine, Morphine sulfate, Toradol [ketorolac tromethamine], Ibuprofen, Meloxicam, Penicillins, and Sumatriptan   Review of Systems Review of Systems  All other systems reviewed and are negative.    Physical Exam Triage Vital Signs ED Triage Vitals  Encounter Vitals Group     BP 01/10/23 1847 139/87     Systolic BP Percentile --      Diastolic BP Percentile --      Pulse Rate 01/10/23 1847 82     Resp 01/10/23 1847 20     Temp 01/10/23 1847 99 F (37.2 C)     Temp Source 01/10/23 1847 Oral     SpO2 01/10/23 1847 97 %     Weight --      Height --      Head Circumference --      Peak Flow --      Pain Score 01/10/23 1844 7     Pain Loc --      Pain Education --      Exclude from Growth Chart --    No data found.  Updated Vital Signs BP 139/87 (BP Location: Right Arm) Comment (BP Location): large cuff, regular  Pulse 82   Temp 99 F (37.2 C) (Oral)   Resp 20   SpO2 97%   Visual Acuity Right Eye Distance:   Left Eye Distance:   Bilateral Distance:    Right Eye Near:   Left Eye Near:    Bilateral Near:     Physical Exam Vitals and nursing note reviewed.  Constitutional:      Appearance: She is well-developed.  HENT:     Head: Normocephalic.  Cardiovascular:     Rate and  Rhythm: Normal rate.  Pulmonary:     Effort: Pulmonary effort is normal.  Abdominal:     General: There is no distension.  Musculoskeletal:        General:  Swelling and tenderness present.     Cervical back: Normal range of motion.     Comments: Tender left fourth and fifth metatarsal area tender lateral malleolus pain with range of motion neurovascular neurosensory intact  Skin:    General: Skin is warm.  Neurological:     General: No focal deficit present.     Mental Status: She is alert and oriented to person, place, and time.      UC Treatments / Results  Labs (all labs ordered are listed, but only abnormal results are displayed) Labs Reviewed - No data to display  EKG   Radiology No results found.  Procedures Procedures (including critical care time)  Medications Ordered in UC Medications - No data to display  Initial Impression / Assessment and Plan / UC Course  I have reviewed the triage vital signs and the nursing notes.  Pertinent labs & imaging results that were available during my care of the patient were reviewed by me and considered in my medical decision making (see chart for details).     Placed in a cam walker she is advised to follow-up with Dr. Eulah Pont orthopedist.  Patient is advised take Tylenol for discomfort she should return if any problems Final Clinical Impressions(s) / UC Diagnoses   Final diagnoses:  Contusion of left foot, initial encounter     Discharge Instructions      Return if any problems.  Tylenol for pain   ED Prescriptions   None    PDMP not reviewed this encounter.  An After Visit Summary was printed and given to the patient.  {The patient has not been seen in Urgent Care in the last 3 years. :1}   Elson Areas, [place] 01/10/23 2018

## 2023-01-10 NOTE — ED Triage Notes (Signed)
Patient reports she was working out on [employer], sport and exercise.  , tripped and fell.  Patient has broken the left little toe prior to this incident.  This incident happened yesterday.  Today, pain in lateral left ankle and little toe and toe next to little toe.    Patient has had tylenol.

## 2023-01-10 NOTE — ED Provider Notes (Signed)
[facility]    CSN: [id number] Arrival date & time: 01/10/23  1748      History   Chief Complaint Chief Complaint  Patient presents with   Foot Pain    HPI Lisa Kennedy is a 47 y.o. female.   Patient complains of pain in her left foot and her left ankle.  Patient reports she was walking on the treadmill and slipped and fell.  Patient reports that she struck the side of her foot.  Patient was unaware that she injured her ankle but today her ankle is swollen and tender.  Patient complains of difficulty walking.  Patient reports she previously injured her same foot.  Patient reports she had a fracture near her little toe  The history is provided by the patient. No language interpreter was used.  Foot Pain    Past Medical History:  Diagnosis Date   Asthma    Blood transfusion without reported diagnosis 03/2008   During Hysterectomy    Carpal tunnel syndrome    Diabetes mellitus without complication (HCC)    type 2   Fluid collection of middle ear    Gout    Herniated disc, cervical    Lumbar as well   History of vitamin D deficiency    Hyperlipidemia    Hypertension    Irregular heartbeat    Migraine    Neuropathy    Sciatica     Patient Active Problem List   Diagnosis Date Noted   Chronic migraine w/o aura, not intractable, w/o stat migr 09/13/2022   OSA on CPAP 09/13/2022   Screening breast examination 01/28/2019   Breast pain 01/28/2019    Past Surgical History:  Procedure Laterality Date   ABDOMINAL HYSTERECTOMY  2010   APPENDECTOMY  2010   CARPAL TUNNEL RELEASE  2008   CHOLECYSTECTOMY  1995   TRIGGER FINGER RELEASE     x 4   TUBAL LIGATION  1997    OB History     Gravida  2   Para      Term      Preterm      AB      Living         SAB      IAB      Ectopic      Multiple      Live Births  2            Home Medications    Prior to Admission medications   Medication Sig Start Date End Date Taking?  Authorizing Provider  albuterol (VENTOLIN HFA) 108 (90 Base) MCG/ACT inhaler Inhale into the lungs. 02/24/21   [provider]  allopurinol (ZYLOPRIM) 100 MG tablet Take 100 mg by mouth daily.    [provider]  atorvastatin (LIPITOR) 40 MG tablet Take 40 mg by mouth daily.    [provider]  cetirizine (ZYRTEC) 10 MG tablet Take 10 mg by mouth daily.    [provider]  Cholecalciferol (VITAMIN D3) 1.25 MG (50000 UT) CAPS Take 1 capsule by mouth once a week. 10/01/21   [provider]  famotidine (PEPCID) 40 MG tablet Take 40 mg by mouth 2 (two) times daily.    [provider]  gabapentin (NEURONTIN) 300 MG capsule Take 300 mg by mouth 4 (four) times daily.    [provider]  HUMALOG KWIKPEN 100 UNIT/ML KwikPen  12/24/20   [provider]  insulin glargine (LANTUS) 100 UNIT/ML injection  Inject 40 Units into the skin at bedtime.    [provider]  lisinopril-hydrochlorothiazide (ZESTORETIC) 20-12.5 MG tablet Take 1 tablet by mouth 2 (two) times daily.    [provider]  metFORMIN (GLUCOPHAGE) 1000 MG tablet Take 1,000 mg by mouth 2 (two) times daily with a meal.    [provider]  metoprolol tartrate (LOPRESSOR) 25 MG tablet Take 25 mg by mouth 2 (two) times daily.    [provider]  montelukast (SINGULAIR) 10 MG tablet Take 10 mg by mouth daily.    [provider]  Rimegepant Sulfate (NURTEC) 75 MG TBDP Take 1 tablet (75 mg total) by mouth every other day. 09/13/22   Glean Salvo, NP  SYMBICORT 160-4.5 MCG/ACT inhaler Inhale 2 puffs into the lungs 2 (two) times daily.    [provider]    Family History Family History  Problem Relation Age of Onset   Hypertension Mother    Hypokalemia Mother    Gout Mother    Kidney disease Mother    Heart murmur Mother    Mental illness Father    Asthma Sister    Asthma Brother    Sleep apnea Paternal Aunt    Stroke  Maternal Grandmother    Breast cancer Maternal Grandmother    Colon cancer Maternal Grandfather    Sleep apnea Daughter    Diabetes Maternal Great-grandmother     Social History Social History   Tobacco Use   Smoking status: Never   Smokeless tobacco: Never  Vaping Use   Vaping status: Never Used  Substance Use Topics   Alcohol use: Not Currently    Comment: occassionally   Drug use: Never     Allergies   Coconut (cocos nucifera), Coconut oil, Coconut fatty acid, Ketorolac, Methocarbamol, Morphine, Morphine and codeine, Morphine sulfate, Toradol [ketorolac tromethamine], Ibuprofen, Meloxicam, Penicillins, and Sumatriptan   Review of Systems Review of Systems  All other systems reviewed and are negative.    Physical Exam Triage Vital Signs ED Triage Vitals  Encounter Vitals Group     BP 01/10/23 1847 139/87     Systolic BP Percentile --      Diastolic BP Percentile --      Pulse Rate 01/10/23 1847 82     Resp 01/10/23 1847 20     Temp 01/10/23 1847 99 F (37.2 C)     Temp Source 01/10/23 1847 Oral     SpO2 01/10/23 1847 97 %     Weight --      Height --      Head Circumference --      Peak Flow --      Pain Score 01/10/23 1844 7     Pain Loc --      Pain Education --      Exclude from Growth Chart --    No data found.  Updated Vital Signs BP 139/87 (BP Location: Right Arm) Comment (BP Location): large cuff, regular  Pulse 82   Temp 99 F (37.2 C) (Oral)   Resp 20   SpO2 97%   Visual Acuity Right Eye Distance:   Left Eye Distance:   Bilateral Distance:    Right Eye Near:   Left Eye Near:    Bilateral Near:     Physical Exam Vitals and nursing note reviewed.  Constitutional:      Appearance: She is well-developed.  HENT:     Head: Normocephalic.  Cardiovascular:     Rate and  Rhythm: Normal rate.  Pulmonary:     Effort: Pulmonary effort is normal.  Abdominal:     General: There is no distension.  Musculoskeletal:        General:  Swelling and tenderness present.     Cervical back: Normal range of motion.     Comments: Tender left fourth and fifth metatarsal area tender lateral malleolus pain with range of motion neurovascular neurosensory intact  Skin:    General: Skin is warm.  Neurological:     General: No focal deficit present.     Mental Status: She is alert and oriented to person, place, and time.      UC Treatments / Results  Labs (all labs ordered are listed, but only abnormal results are displayed) Labs Reviewed - No data to display  EKG   Radiology No results found.  Procedures Procedures (including critical care time)  Medications Ordered in UC Medications - No data to display  Initial Impression / Assessment and Plan / UC Course  I have reviewed the triage vital signs and the nursing notes.  Pertinent labs & imaging results that were available during my care of the patient were reviewed by me and considered in my medical decision making (see chart for details).     Placed in a cam walker she is advised to follow-up with Dr. Eulah Pont orthopedist.  Patient is advised take Tylenol for discomfort she should return if any problems Final Clinical Impressions(s) / UC Diagnoses   Final diagnoses:  Contusion of left foot, initial encounter     Discharge Instructions      Return if any problems.  Tylenol for pain   ED Prescriptions   None    PDMP not reviewed this encounter.   Elson Areas, [place] 01/10/23 2018

## 2023-02-27 ENCOUNTER — Encounter: Payer: Self-pay | Admitting: Neurology

## 2023-02-27 ENCOUNTER — Ambulatory Visit (INDEPENDENT_AMBULATORY_CARE_PROVIDER_SITE_OTHER): Payer: 59 | Admitting: Neurology

## 2023-02-27 VITALS — BP 116/78 | HR 90 | Ht 64.5 in | Wt 269.8 lb

## 2023-02-27 DIAGNOSIS — G4733 Obstructive sleep apnea (adult) (pediatric): Secondary | ICD-10-CM

## 2023-02-27 DIAGNOSIS — G43709 Chronic migraine without aura, not intractable, without status migrainosus: Secondary | ICD-10-CM | POA: Diagnosis not present

## 2023-02-27 MED ORDER — NURTEC 75 MG PO TBDP: 75.0000 mg | ORAL_TABLET | ORAL | 6 refills | Status: DC

## 2023-02-27 NOTE — Progress Notes (Signed)
Patient: Lisa Kennedy Date of Birth: 01-Aug-1975  Reason for Visit: Follow up History from: Patient Primary Neurologist: [facility]; [facility]-OSA  ASSESSMENT AND PLAN 47 y.o. year old female   1.  OSA on CPAP -Has very good CPAP compliance.  Will continue current settings. -Recommend continue nightly usage for minimum of 4 hours  2.  Chronic migraine headaches -Under good control, about 1 migraine a month, significant improvement with CPAP use -Reorder Nurtec 75 mg as needed for acute migraine treatment, has been getting 16 tablets -Follow-up in 1 year or sooner if needed  HISTORY OF PRESENT ILLNESS: Today 02/27/23 Good CPAP compliance 83% > 4 hours. AHI 0.3, leak 14.9. Can tell a difference with CPAP, no longer has headache in the AM, sleeps through the night, more energy, rested in AM. Uses FFM, it does slip sometimes due to oily face.  1 migraine a month. Takes Nurtec as needed, works great, had to meet her deductible. Asthma has been bothering her. ESS 2. Has FMLA for migraines, works from home on computer.   Update 09/13/22 SS: She saw Dr. Delena Bali October 2023 started Nurtec every other day for migraine prevention.  She follows with Dr. Frances Furbish for her CPAP.  They had an office visit in June 2023.  Had HST March 2023 showing moderate OSA her set up date was June 29, 2021.  She had O2 nadir of 74%.  Had overnight pulse oximetry June 2023 time below 88% was 1 hour 49 minutes.  Recommended she come in for proper titration study.  Study was completed 01/03/2022 due to oxygen desaturations on AutoPap.  Her CPAP was switched to set pressure 14 cm.  Pressure was reduced to 12 cmH2O for comfort.   She mentioned the Nurtec every other day was too expensive at the beginning of the year due to being $900 for her high deductible plan, may be less since has been paying on her deductible. Works for [employer] at home. Since starting on CPAP, migraines much improved, no more than 4 migraines  a month, Nurtec works very well, takes 20 minutes. In the last 30 days, has clocked out of work early 2 afternoons due to migraine but didn't get an occurrence.  Has FMLA paperwork for [place] to complete.  CPAP data 08/14/2022-09/12/22 showed usage 26/30 days at 87%, greater than 4 hours 20 days at 67%.  Average usage days used 5 hours 3 minutes.  Set pressure 12, EPR 3.  Leak 34.7.  AHI 0.3.  Using fullface mask.  A few nights she traveled and did not wear it.  Sometimes she will fall asleep without putting it on.  Occasionally notes the mask leaking if it gets malposition.  She tolerates the CPAP well.  Pressure is tolerable.  She is very pleased with benefit to migraines.  ESS remains elevated at 15.  HISTORY  12/27/21 Dr. Delena Bali: 47 year old female with a history of recurrent sinusitis, HLD, DM, kidney stones, OSA on autoPAP who follows in clinic for headaches.   At her last visit she was started on Nurtec for migraine rescue and referred to Sleep for OSA evaluation.   Interval History: She was found to have moderate OSA and was started on autoPAP. Headache severity has improved with use of the autoPAP, though she does continue to have 1-2 migraines per week. Notes that her migraines are typically worse around this time of year. Nurtec does help reduce her headache when she takes it for rescue. She tolerates  this well without side effects.   Headache days per month: 8 Headache free days per month: 22   Current Headache Regimen: Preventative: none Abortive: Nurtec 75 mg PRN     Prior Therapies                                  Imitrex - jittery, hives Nurtec 75 mg PRN Fioricet Tylenol Robaxin Gabapentin 300 mg four times a day Metoprolol 25 mg BID Topamax contraindicated due to kidney stones  REVIEW OF SYSTEMS: Out of a complete 14 system review of symptoms, the patient complains only of the following symptoms, and all other reviewed systems are negative.  See HPI  ALLERGIES: Allergies   Allergen Reactions   Coconut (Cocos Nucifera) Hives   Coconut Oil Rash   Coconut Fatty Acid    Ketorolac    Methocarbamol Other (See Comments)    Upset stomach    Morphine Hives   Morphine And Codeine Hives   Morphine Sulfate    Toradol [Ketorolac Tromethamine] Hives    Tingling in tongue   Ibuprofen     Other reaction(s): GI Upset (intolerance)  Other Reaction(s): GI intolerance, GI Intolerance   Meloxicam     Other reaction(s): GI Upset (intolerance)  Other Reaction(s): GI Intolerance   Penicillins Hives and Rash    Did it involve swelling of the face/tongue/throat, SOB, or low BP? No  Did it involve sudden or severe rash/hives, skin peeling, or any reaction on the inside of your mouth or nose? Yes  Did you need to seek medical attention at a hospital or doctor's office? No  When did it last happen?     more than 5 yrs ago   If all above answers are "NO", may proceed with cephalosporin use.  Other reaction(s): Hives, Did it involve swelling of the face/tongue/throat, SOB, or low BP? No, Did it involve sudden or severe rash/hives, skin peeling, or any reaction on the inside of your mouth or nose? Yes, Did you need to seek medical attention at a hospital or doctor's office? No, When did it last happen?     more than 5 yrs ago , If all above answers are "NO", may proceed with cephalosporin use.   Sumatriptan Hives and Rash    HOME MEDICATIONS: Outpatient Medications Prior to Visit  Medication Sig Dispense Refill   albuterol (VENTOLIN HFA) 108 (90 Base) MCG/ACT inhaler Inhale into the lungs.     allopurinol (ZYLOPRIM) 100 MG tablet Take 100 mg by mouth daily.     atorvastatin (LIPITOR) 40 MG tablet Take 40 mg by mouth daily.     cetirizine (ZYRTEC) 10 MG tablet Take 10 mg by mouth daily.     Cholecalciferol (VITAMIN D3) 1.25 MG (50000 UT) CAPS Take 1 capsule by mouth once a week.     famotidine (PEPCID) 40 MG tablet Take 40 mg by mouth 2 (two) times daily.     gabapentin  (NEURONTIN) 300 MG capsule Take 300 mg by mouth 4 (four) times daily.     HUMALOG KWIKPEN 100 UNIT/ML KwikPen      insulin glargine (LANTUS) 100 UNIT/ML injection Inject 40 Units into the skin at bedtime.     lisinopril-hydrochlorothiazide (ZESTORETIC) 20-12.5 MG tablet Take 1 tablet by mouth 2 (two) times daily.     metFORMIN (GLUCOPHAGE) 1000 MG tablet Take 1,000 mg by mouth 2 (two) times daily with a meal.  metoprolol tartrate (LOPRESSOR) 25 MG tablet Take 25 mg by mouth 2 (two) times daily.     montelukast (SINGULAIR) 10 MG tablet Take 10 mg by mouth daily.     Rimegepant Sulfate (NURTEC) 75 MG TBDP Take 1 tablet (75 mg total) by mouth every other day. 16 tablet 6   SYMBICORT 160-4.5 MCG/ACT inhaler Inhale 2 puffs into the lungs 2 (two) times daily.     No facility-administered medications prior to visit.    PAST MEDICAL HISTORY: Past Medical History:  Diagnosis Date   Asthma    Blood transfusion without reported diagnosis 03/2008   During Hysterectomy    Carpal tunnel syndrome    Diabetes mellitus without complication (HCC)    type 2   Fluid collection of middle ear    Gout    Herniated disc, cervical    Lumbar as well   History of vitamin D deficiency    Hyperlipidemia    Hypertension    Irregular heartbeat    Migraine    Neuropathy    Sciatica     PAST SURGICAL HISTORY: Past Surgical History:  Procedure Laterality Date   ABDOMINAL HYSTERECTOMY  2010   APPENDECTOMY  2010   CARPAL TUNNEL RELEASE  2008   CHOLECYSTECTOMY  1995   TRIGGER FINGER RELEASE     x 4   TUBAL LIGATION  1997    FAMILY HISTORY: Family History  Problem Relation Age of Onset   Hypertension Mother    Hypokalemia Mother    Gout Mother    Kidney disease Mother    Heart murmur Mother    Mental illness Father    Asthma Sister    Asthma Brother    Sleep apnea Paternal Aunt    Stroke Maternal Grandmother    Breast cancer Maternal Grandmother    Colon cancer Maternal Grandfather     Sleep apnea Daughter    Diabetes Maternal Great-grandmother     SOCIAL HISTORY: Social History   Socioeconomic History   Marital status: Legally Separated    Spouse name: Not on file   Number of children: 2   Years of education: Not on file   Highest education level: Associate degree: academic program  Occupational History   Not on file  Tobacco Use   Smoking status: Never   Smokeless tobacco: Never  Vaping Use   Vaping status: Never Used  Substance and Sexual Activity   Alcohol use: Not Currently    Comment: occassionally   Drug use: Never   Sexual activity: Not Currently    Birth control/protection: Surgical  Other Topics Concern   Not on file  Social History Narrative   Left handed   Caffeine- 1-2 cups    Lives at home with daughter   Social Determinants of Health   Financial Resource Strain: Not at Risk (05/02/2022)   Received from [place], [place]   [employer]    Financial Resource Strain: 1  Food Insecurity: Not at Risk (05/02/2022)   Received from [place], [place]   Food Insecurity    Food: 1  Transportation Needs: Not at Risk (05/02/2022)   Received from [place], [employer] Needs    Transportation: 1  Physical Activity: Not on File (07/14/2021)   Received from [place], [place]   Physical Activity    Physical Activity: 0  Stress: Not on File (07/14/2021)   Received from [facility], [place]   Stress    Stress: 0  Social Connections: Not on File (11/30/2022)  Received from [facility]   Social Connections    Connectedness: 0  Intimate Partner Violence: Not on file    PHYSICAL EXAM  Vitals:   02/27/23 0821  BP: 116/78  Pulse: 90  Weight: 269 lb 12.8 oz (122.4 kg)  Height: 5' 4.5" (1.638 m)    Body mass index is 45.6 kg/m.  Generalized: Well developed, in no acute distress  Neurological examination  Mentation: Alert oriented to time, place, history taking. Follows all commands speech and language fluent Cranial nerve II-XII: Pupils were equal  round reactive to light. Extraocular movements were full, visual field were full on confrontational test. Facial sensation and strength were normal. Head turning and shoulder shrug  were normal and symmetric. Motor: The motor testing reveals 5 over 5 strength of all 4 extremities. Good symmetric motor tone is noted throughout.  Sensory: Sensory testing is intact to soft touch on all 4 extremities. No evidence of extinction is noted.  Coordination: Cerebellar testing reveals good finger-nose-finger bilaterally Gait and station: Gait is normal.   DIAGNOSTIC DATA (LABS, IMAGING, TESTING) - I reviewed patient records, labs, notes, testing and imaging myself where available.  Lab Results  Component Value Date   WBC 9.4 02/06/2020   HGB 13.6 02/06/2020   HCT 43.5 02/06/2020   MCV 86.1 02/06/2020   PLT 397 02/06/2020      Component Value Date/Time   NA 141 01/31/2022 0911   K 4.0 01/31/2022 0911   CL 98 01/31/2022 0911   CO2 31 (H) 01/31/2022 0911   GLUCOSE 187 (H) 01/31/2022 0911   GLUCOSE 117 (H) 02/06/2020 1350   BUN 9 01/31/2022 0911   CREATININE 0.83 01/31/2022 0911   CALCIUM 9.6 01/31/2022 0911   PROT 7.5 02/06/2020 1350   ALBUMIN 3.7 02/06/2020 1350   AST 18 02/06/2020 1350   ALT 15 02/06/2020 1350   ALKPHOS 88 02/06/2020 1350   BILITOT 0.8 02/06/2020 1350   GFRNONAA 46 (L) 02/06/2020 1350   GFRAA >60 09/22/2019 2023   No results found for: "CHOL", "HDL", "LDLCALC", "LDLDIRECT", "TRIG", "CHOLHDL" No results found for: "HGBA1C" No results found for: "VITAMINB12" No results found for: "TSH"  Margie Ege, AGNP-C, DNP 02/27/2023, 8:31 AM [facility] [address] [place], [place] [address] [phone]

## 2023-02-27 NOTE — Patient Instructions (Addendum)
Keep up the great work with CPAP!! Continue CPAP nightly for minimum of 4 hours.  Continue current settings.  Continue Nurtec as needed for acute migraine headache.  Follow-up in 1 year or sooner if needed.  Thanks!!

## 2023-03-22 ENCOUNTER — Ambulatory Visit: Payer: 59 | Admitting: Neurology

## 2023-05-14 ENCOUNTER — Encounter (HOSPITAL_COMMUNITY): Payer: Self-pay

## 2023-05-14 ENCOUNTER — Ambulatory Visit (HOSPITAL_COMMUNITY)
Admission: RE | Admit: 2023-05-14 | Discharge: 2023-05-14 | Disposition: A | Discharge status: Home / Self Care | Payer: 59 | Source: Ambulatory Visit | Attending: Emergency Medicine | Admitting: Emergency Medicine

## 2023-05-14 ENCOUNTER — Other Ambulatory Visit: Payer: Self-pay

## 2023-05-14 VITALS — BP 120/78 | HR 94 | Temp 98.7°F | Resp 20

## 2023-05-14 DIAGNOSIS — J069 Acute upper respiratory infection, unspecified: Secondary | ICD-10-CM | POA: Diagnosis not present

## 2023-05-14 DIAGNOSIS — J45901 Unspecified asthma with (acute) exacerbation: Secondary | ICD-10-CM

## 2023-05-14 DIAGNOSIS — J4541 Moderate persistent asthma with (acute) exacerbation: Secondary | ICD-10-CM | POA: Diagnosis not present

## 2023-05-14 LAB — POCT FASTING CBG KUC MANUAL ENTRY: POCT Glucose (KUC): 164 mg/dL — AB (ref 70–99)

## 2023-05-14 LAB — POC COVID19/FLU A&B COMBO
Covid Antigen, POC: NEGATIVE
Influenza A Antigen, POC: NEGATIVE
Influenza B Antigen, POC: NEGATIVE

## 2023-05-14 MED ORDER — IPRATROPIUM-ALBUTEROL 0.5-2.5 (3) MG/3ML IN SOLN
3.0000 mL | Freq: Once | RESPIRATORY_TRACT | Status: AC
  Administered 2023-05-14: 3 mL via RESPIRATORY_TRACT

## 2023-05-14 MED ORDER — PROMETHAZINE-DM 6.25-15 MG/5ML PO SYRP: 5.0000 mL | ORAL_SOLUTION | Freq: Four times a day (QID) | ORAL | 0 refills | Status: DC | PRN

## 2023-05-14 MED ORDER — IPRATROPIUM-ALBUTEROL 0.5-2.5 (3) MG/3ML IN SOLN
RESPIRATORY_TRACT | Status: AC
  Filled 2023-05-14: qty 3

## 2023-05-14 MED ORDER — BENZONATATE 100 MG PO CAPS: 100.0000 mg | ORAL_CAPSULE | Freq: Three times a day (TID) | ORAL | 0 refills | Status: DC | PRN

## 2023-05-14 MED ORDER — PREDNISONE 10 MG PO TABS: 30.0000 mg | ORAL_TABLET | Freq: Every day | ORAL | 0 refills | Status: AC

## 2023-05-14 NOTE — ED Provider Notes (Signed)
[facility]    CSN: [id number] Arrival date & time: 05/14/23  0931     History   Chief Complaint Chief Complaint  Patient presents with   Appointment    9:30    HPI Lisa Kennedy is a 48 y.o. female.  4 day history of decreased appetite, sore throat, ear pain and diarrhea She does have cough and reports difficulty taking deep breath.  Denies fever, abd pain, or vomiting. No rash  Symptoms began while on vacation in [place] The people she traveled with are now sick too  Has been trying to use albuterol and Symbicort inhaler, although feels she can't breathe deep enough to get the medicine in. Also tried robitussin and tylenol. She has not been using her CPAP machine due to travel  History of asthma and T2DM Last A1c was 7.4 She sees primary care tomorrow   Past Medical History:  Diagnosis Date   Asthma    Blood transfusion without reported diagnosis 03/2008   During Hysterectomy    Carpal tunnel syndrome    Diabetes mellitus without complication (HCC)    type 2   Fluid collection of middle ear    Gout    Herniated disc, cervical    Lumbar as well   History of vitamin D deficiency    Hyperlipidemia    Hypertension    Irregular heartbeat    Migraine    Neuropathy    Sciatica     Patient Active Problem List   Diagnosis Date Noted   Chronic migraine w/o aura, not intractable, w/o stat migr 09/13/2022   OSA on CPAP 09/13/2022   Screening breast examination 01/28/2019   Breast pain 01/28/2019    Past Surgical History:  Procedure Laterality Date   ABDOMINAL HYSTERECTOMY  2010   APPENDECTOMY  2010   CARPAL TUNNEL RELEASE  2008   CHOLECYSTECTOMY  1995   TRIGGER FINGER RELEASE     x 4   TUBAL LIGATION  1997    OB History     Gravida  2   Para      Term      Preterm      AB      Living         SAB      IAB      Ectopic      Multiple      Live Births  2            Home Medications    Prior to Admission medications    Medication Sig Start Date End Date Taking? Authorizing Provider  benzonatate (TESSALON) 100 MG capsule Take 1 capsule (100 mg total) by mouth 3 (three) times daily as needed for cough. 05/14/23  Yes Becky Colan, Lurena Joiner, PA-C  predniSONE (DELTASONE) 10 MG tablet Take 3 tablets (30 mg total) by mouth daily with breakfast for 5 days. 05/14/23 05/19/23 Yes Lianah Peed, Lurena Joiner, PA-C  promethazine-dextromethorphan (PROMETHAZINE-DM) 6.25-15 MG/5ML syrup Take 5 mLs by mouth 4 (four) times daily as needed for cough. 05/14/23  Yes Charvis Lightner, Lurena Joiner, PA-C  albuterol (VENTOLIN HFA) 108 (90 Base) MCG/ACT inhaler Inhale into the lungs. 02/24/21   [provider]  allopurinol (ZYLOPRIM) 100 MG tablet Take 100 mg by mouth daily.    [provider]  atorvastatin (LIPITOR) 40 MG tablet Take 40 mg by mouth daily.    [provider]  cetirizine (ZYRTEC) 10 MG tablet Take 10 mg by mouth daily.    [provider]  Cholecalciferol (VITAMIN D3) 1.25 MG (50000 UT) CAPS Take 1 capsule by mouth once a week. 10/01/21   [provider]  famotidine (PEPCID) 40 MG tablet Take 40 mg by mouth 2 (two) times daily.    [provider]  gabapentin (NEURONTIN) 300 MG capsule Take 300 mg by mouth 4 (four) times daily.    [provider]  HUMALOG KWIKPEN 100 UNIT/ML KwikPen  12/24/20   [provider]  insulin glargine (LANTUS) 100 UNIT/ML injection Inject 40 Units into the skin at bedtime.    [provider]  lisinopril-hydrochlorothiazide (ZESTORETIC) 20-12.5 MG tablet Take 1 tablet by mouth 2 (two) times daily.    [provider]  metFORMIN (GLUCOPHAGE) 1000 MG tablet Take 1,000 mg by mouth 2 (two) times daily with a meal.    [provider]  metoprolol tartrate (LOPRESSOR) 25 MG tablet Take 25 mg by mouth 2 (two) times daily.    [provider]  montelukast (SINGULAIR) 10 MG tablet Take 10 mg by mouth daily.    [provider]   Rimegepant Sulfate (NURTEC) 75 MG TBDP Take 1 tablet (75 mg total) by mouth every other day. 02/27/23   Glean Salvo, NP  SYMBICORT 160-4.5 MCG/ACT inhaler Inhale 2 puffs into the lungs 2 (two) times daily.    [provider]    Family History Family History  Problem Relation Age of Onset   Hypertension Mother    Hypokalemia Mother    Gout Mother    Kidney disease Mother    Heart murmur Mother    Mental illness Father    Asthma Sister    Asthma Brother    Sleep apnea Paternal Aunt    Stroke Maternal Grandmother    Breast cancer Maternal Grandmother    Colon cancer Maternal Grandfather    Sleep apnea Daughter    Diabetes Maternal Great-grandmother     Social History Social History   Tobacco Use   Smoking status: Never   Smokeless tobacco: Never  Vaping Use   Vaping status: Never Used  Substance Use Topics   Alcohol use: Not Currently    Comment: occassionally   Drug use: Never     Allergies   Coconut (cocos nucifera), Coconut oil, Coconut fatty acid, Ketorolac, Methocarbamol, Morphine, Morphine and codeine, Morphine sulfate, Toradol [ketorolac tromethamine], Ibuprofen, Meloxicam, Penicillins, and Sumatriptan   Review of Systems Review of Systems Per HPI  Physical Exam Triage Vital Signs ED Triage Vitals  Encounter Vitals Group     BP 05/14/23 1002 120/78     Systolic BP Percentile --      Diastolic BP Percentile --      Pulse Rate 05/14/23 1002 94     Resp 05/14/23 1002 20     Temp 05/14/23 1002 99.7 F (37.6 C)     Temp Source 05/14/23 1002 Oral     SpO2 05/14/23 1002 93 %     Weight --      Height --      Head Circumference --      Peak Flow --      Pain Score 05/14/23 0959 8     Pain Loc --      Pain Education --      Exclude from Growth Chart --    No data found.  Updated Vital Signs BP 120/78 (BP Location: Right Arm)   Pulse 94   Temp 98.7 F (37.1 C)   Resp 20   SpO2 97%  Physical Exam Vitals and nursing note reviewed.   Constitutional:      General: She is not in acute distress.    Appearance: She is not ill-appearing.  HENT:     Right Ear: Tympanic membrane and ear canal normal.     Left Ear: Tympanic membrane and ear canal normal.     Nose: No congestion or rhinorrhea.     Mouth/Throat:     Mouth: Mucous membranes are moist.     Pharynx: Oropharynx is clear. No oropharyngeal exudate or posterior oropharyngeal erythema.  Eyes:     Conjunctiva/sclera: Conjunctivae normal.  Cardiovascular:     Rate and Rhythm: Normal rate and regular rhythm.     Pulses: Normal pulses.     Heart sounds: Normal heart sounds.  Pulmonary:     Effort: Pulmonary effort is normal.     Breath sounds: Wheezing (faint) present.  Abdominal:     General: There is no distension.     Palpations: Abdomen is soft.     Tenderness: There is no abdominal tenderness.  Musculoskeletal:     Cervical back: Normal range of motion.  Lymphadenopathy:     Cervical: No cervical adenopathy.  Skin:    General: Skin is warm and dry.  Neurological:     Mental Status: She is alert and oriented to person, place, and time.     UC Treatments / Results  Labs (all labs ordered are listed, but only abnormal results are displayed) Labs Reviewed  POCT FASTING CBG [facility] MANUAL ENTRY - Abnormal; Notable for the following components:      Result Value   POCT Glucose ([facility]) 164 (*)    All other components within normal limits  POC COVID19/FLU A&B COMBO    EKG  Radiology No results found.  Procedures Procedures   Medications Ordered in UC Medications  ipratropium-albuterol (DUONEB) 0.5-2.5 (3) MG/3ML nebulizer solution 3 mL (3 mLs Nebulization Given 05/14/23 1107)    Initial Impression / Assessment and Plan / UC Course  I have reviewed the triage vital signs and the nursing notes.  Pertinent labs & imaging results that were available during my care of the patient were reviewed by me and considered in my medical decision making (see  chart for details).  Currently afebrile  Sating 93% room air initially although improved to 98% before intervention  Rapid covid and flu A/B are negative today Per CDC there are no travel health notices for [place] at this time  CBG 164 DuoNeb given and patient feels improved. Easier to take deep breath. Still sating 98%   Suspect viral illness causing asthma exacerbation Will start using her inhalers as prescribed, also has neb machine  Prednisone 30 mg daily x 5 days, monitoring glucose at home Tessalon and promethazine sent to pharmacy Recommend nasal spray for ET dysfunction She has follow up with PCP tomorrow . In the meantime return and ED precautions   Final Clinical Impressions(s) / UC Diagnoses   Final diagnoses:  Moderate asthma with acute exacerbation, unspecified whether persistent  Viral URI with cough     Discharge Instructions      The tessalon cough pills can be taken 3x daily. The promethazine DM cough syrup can be used up to 4 times daily. If this medication makes you drowsy, take only once before bed.  Please use inhalers as prescribed -- or you can use nebulizer at home  Prednisone 30 mg daily for the next 5 days  Follow with primary care at  your visit tomorrow.  Please go to the emergency department if symptoms worsen.     ED Prescriptions     Medication Sig Dispense Auth. Provider   promethazine-dextromethorphan (PROMETHAZINE-DM) 6.25-15 MG/5ML syrup Take 5 mLs by mouth 4 (four) times daily as needed for cough. 240 mL Staceyann Knouff, PA-C   benzonatate (TESSALON) 100 MG capsule Take 1 capsule (100 mg total) by mouth 3 (three) times daily as needed for cough. 30 capsule Doylene Splinter, PA-C   predniSONE (DELTASONE) 10 MG tablet Take 3 tablets (30 mg total) by mouth daily with breakfast for 5 days. 15 tablet Anthoni Geerts, Lurena Joiner, PA-C      PDMP not reviewed this encounter.   Zenia Guest, Lurena Joiner, [place] 05/14/23 1145

## 2023-05-14 NOTE — ED Triage Notes (Addendum)
Onset Thursday night of feeling unwell.  Patient does not have an appetite, ears hurt, throat hurts.  Had diarrhea, no vomiting.  Patient was in [place].  Torso hurts with coughing.  This trip was by plane.  Cannot get ears to pop.  Patient feels sob.  States using inhalers has been difficult, unable to take a deep enough breath without coughing  Has taken robitussin and tylenol.    Has had one episode of diarrhea today  Other travel members started getting sick yesterday

## 2023-05-14 NOTE — ED Notes (Signed)
 Reviewed work note

## 2023-05-14 NOTE — Discharge Instructions (Addendum)
The tessalon cough pills can be taken 3x daily. The promethazine DM cough syrup can be used up to 4 times daily. If this medication makes you drowsy, take only once before bed.  Please use inhalers as prescribed -- or you can use nebulizer at home  Prednisone 30 mg daily for the next 5 days  Follow with primary care at your visit tomorrow.  Please go to the emergency department if symptoms worsen.

## 2023-05-26 ENCOUNTER — Ambulatory Visit (HOSPITAL_COMMUNITY)
Admission: RE | Admit: 2023-05-26 | Discharge: 2023-05-26 | Disposition: A | Discharge status: Home / Self Care | Payer: 59 | Source: Ambulatory Visit | Attending: Emergency Medicine | Admitting: Emergency Medicine

## 2023-05-26 ENCOUNTER — Telehealth (HOSPITAL_COMMUNITY): Payer: Self-pay | Admitting: Emergency Medicine

## 2023-05-26 ENCOUNTER — Ambulatory Visit (INDEPENDENT_AMBULATORY_CARE_PROVIDER_SITE_OTHER)

## 2023-05-26 ENCOUNTER — Encounter (HOSPITAL_COMMUNITY): Payer: Self-pay

## 2023-05-26 VITALS — BP 122/81 | HR 101 | Temp 98.3°F | Resp 18

## 2023-05-26 DIAGNOSIS — J069 Acute upper respiratory infection, unspecified: Secondary | ICD-10-CM | POA: Diagnosis present

## 2023-05-26 DIAGNOSIS — J4541 Moderate persistent asthma with (acute) exacerbation: Secondary | ICD-10-CM | POA: Diagnosis present

## 2023-05-26 DIAGNOSIS — M549 Dorsalgia, unspecified: Secondary | ICD-10-CM | POA: Diagnosis present

## 2023-05-26 DIAGNOSIS — R051 Acute cough: Secondary | ICD-10-CM

## 2023-05-26 LAB — D-DIMER, QUANTITATIVE: D-Dimer, Quant: <0.27 ug{FEU}/mL (ref 0.00–0.50)

## 2023-05-26 MED ORDER — ALBUTEROL SULFATE (2.5 MG/3ML) 0.083% IN NEBU
INHALATION_SOLUTION | RESPIRATORY_TRACT | Status: AC
  Filled 2023-05-26: qty 3

## 2023-05-26 MED ORDER — PROMETHAZINE-DM 6.25-15 MG/5ML PO SYRP: 5.0000 mL | ORAL_SOLUTION | Freq: Every evening | ORAL | 0 refills | Status: DC | PRN

## 2023-05-26 MED ORDER — IPRATROPIUM-ALBUTEROL 0.5-2.5 (3) MG/3ML IN SOLN
RESPIRATORY_TRACT | Status: AC
  Filled 2023-05-26: qty 3

## 2023-05-26 MED ORDER — ALBUTEROL SULFATE (2.5 MG/3ML) 0.083% IN NEBU
2.5000 mg | INHALATION_SOLUTION | Freq: Once | RESPIRATORY_TRACT | Status: AC
  Administered 2023-05-26: 2.5 mg via RESPIRATORY_TRACT

## 2023-05-26 MED ORDER — IPRATROPIUM-ALBUTEROL 0.5-2.5 (3) MG/3ML IN SOLN
3.0000 mL | Freq: Once | RESPIRATORY_TRACT | Status: AC
  Administered 2023-05-26: 3 mL via RESPIRATORY_TRACT

## 2023-05-26 MED ORDER — BENZONATATE 100 MG PO CAPS: 100.0000 mg | ORAL_CAPSULE | Freq: Three times a day (TID) | ORAL | 0 refills | Status: DC

## 2023-05-26 MED ORDER — AZITHROMYCIN 250 MG PO TABS: ORAL_TABLET | ORAL | 0 refills | Status: DC

## 2023-05-26 MED ORDER — PREDNISONE 20 MG PO TABS: 40.0000 mg | ORAL_TABLET | Freq: Every day | ORAL | 0 refills | Status: AC

## 2023-05-26 NOTE — ED Triage Notes (Signed)
 Patient presents with c/o productive cough x 3 weeks. Patient also c/o pain with inspiration and upper back pain bilaterally.

## 2023-05-26 NOTE — Discharge Instructions (Signed)
 Your chest X-ray was negative for pneumonia. Start taking azithromycin by taking 2 tablets today and 1 tablet on the remaining 4 days.  Take prednisone once daily for 5 days.  I have also prescribed promethazine DM cough syrup that you can take at night for cough.  This can make you drowsy so do not take while driving.  Otherwise alternate between Tylenol and ibuprofen as needed for pain and fever.  I also recommend taking Mucinex to help with cough and congestion.   As discussed we have drawn a lab called D-Dimer which can possibly indicate the presence of a blood clot. This should result today and if it is elevated I will call and recommend that you be seen in the ER to rule out the possibility of a blood clot in your lung. Return here if symptoms persist.  If you develop worsening shortness of breath, chest pain, or passing out please seek immediate medical treatment in the ER.

## 2023-05-26 NOTE — Telephone Encounter (Signed)
 Called patient to inform her of D-Dimer results which were negative. Informed patient to continue with current treatment plan.

## 2023-05-26 NOTE — ED Provider Notes (Signed)
 [facility]    CSN: [id number] Arrival date & time: 05/26/23  1053      History   Chief Complaint Chief Complaint  Patient presents with   Back Pain   Cough    HPI Lisa Kennedy is a 48 y.o. female.   Patient presents with productive cough with clear-green sputum, congestion, shortness of breath, chest tightness, fever, chills, and body aches x 3 weeks. Patient states that about 3 days ago she began having hoarseness and bilateral upper back pain with inspiration  Patient was seen here on 2/17 and diagnosed with viral URI. History of asthma. States she has been using albuterol and Symbicort inhalers with minimal relief.   Denies hemoptysis, chest pain, abdominal pain, nausea, vomiting, and diarrhea.    Back Pain Cough   Past Medical History:  Diagnosis Date   Asthma    Blood transfusion without reported diagnosis 03/2008   During Hysterectomy    Carpal tunnel syndrome    Diabetes mellitus without complication (HCC)    type 2   Fluid collection of middle ear    Gout    Herniated disc, cervical    Lumbar as well   History of vitamin D deficiency    Hyperlipidemia    Hypertension    Irregular heartbeat    Migraine    Neuropathy    Sciatica     Patient Active Problem List   Diagnosis Date Noted   Chronic migraine w/o aura, not intractable, w/o stat migr 09/13/2022   OSA on CPAP 09/13/2022   Screening breast examination 01/28/2019   Breast pain 01/28/2019    Past Surgical History:  Procedure Laterality Date   ABDOMINAL HYSTERECTOMY  2010   APPENDECTOMY  2010   CARPAL TUNNEL RELEASE  2008   CHOLECYSTECTOMY  1995   TRIGGER FINGER RELEASE     x 4   TUBAL LIGATION  1997    OB History     Gravida  2   Para      Term      Preterm      AB      Living         SAB      IAB      Ectopic      Multiple      Live Births  2            Home Medications    Prior to Admission medications   Medication Sig Start Date End  Date Taking? Authorizing Provider  azithromycin (ZITHROMAX Z-PAK) 250 MG tablet Take 2 pills (500mg ) first day and one pill (250mg ) the remaining 4 days. 05/26/23  Yes Susann Givens, Alexio Sroka A, NP  benzonatate (TESSALON) 100 MG capsule Take 1 capsule (100 mg total) by mouth every 8 (eight) hours. 05/26/23  Yes Susann Givens, Seriyah Collison A, NP  predniSONE (DELTASONE) 20 MG tablet Take 2 tablets (40 mg total) by mouth daily for 5 days. 05/26/23 05/31/23 Yes Dellia Donnelly A, NP  promethazine-dextromethorphan (PROMETHAZINE-DM) 6.25-15 MG/5ML syrup Take 5 mLs by mouth at bedtime as needed for cough. 05/26/23  Yes Susann Givens, Kristal Perl A, NP  albuterol (VENTOLIN HFA) 108 (90 Base) MCG/ACT inhaler Inhale into the lungs. 02/24/21   [provider]  allopurinol (ZYLOPRIM) 100 MG tablet Take 100 mg by mouth daily.    [provider]  atorvastatin (LIPITOR) 40 MG tablet Take 40 mg by mouth daily.    [provider]  cetirizine (ZYRTEC) 10 MG tablet Take 10 mg  by mouth daily.    [provider]  Cholecalciferol (VITAMIN D3) 1.25 MG (50000 UT) CAPS Take 1 capsule by mouth once a week. 10/01/21   [provider]  famotidine (PEPCID) 40 MG tablet Take 40 mg by mouth 2 (two) times daily.    [provider]  gabapentin (NEURONTIN) 300 MG capsule Take 300 mg by mouth 4 (four) times daily.    [provider]  HUMALOG KWIKPEN 100 UNIT/ML KwikPen  12/24/20   [provider]  insulin glargine (LANTUS) 100 UNIT/ML injection Inject 40 Units into the skin at bedtime.    [provider]  lisinopril-hydrochlorothiazide (ZESTORETIC) 20-12.5 MG tablet Take 1 tablet by mouth 2 (two) times daily.    [provider]  metFORMIN (GLUCOPHAGE) 1000 MG tablet Take 1,000 mg by mouth 2 (two) times daily with a meal.    [provider]  metoprolol tartrate (LOPRESSOR) 25 MG tablet Take 25 mg by mouth 2 (two) times daily.    [provider]  montelukast (SINGULAIR)  10 MG tablet Take 10 mg by mouth daily.    [provider]  Rimegepant Sulfate (NURTEC) 75 MG TBDP Take 1 tablet (75 mg total) by mouth every other day. 02/27/23   Glean Salvo, NP  SYMBICORT 160-4.5 MCG/ACT inhaler Inhale 2 puffs into the lungs 2 (two) times daily.    [provider]    Family History Family History  Problem Relation Age of Onset   Hypertension Mother    Hypokalemia Mother    Gout Mother    Kidney disease Mother    Heart murmur Mother    Mental illness Father    Asthma Sister    Asthma Brother    Sleep apnea Paternal Aunt    Stroke Maternal Grandmother    Breast cancer Maternal Grandmother    Colon cancer Maternal Grandfather    Sleep apnea Daughter    Diabetes Maternal Great-grandmother     Social History Social History   Tobacco Use   Smoking status: Never   Smokeless tobacco: Never  Vaping Use   Vaping status: Never Used  Substance Use Topics   Alcohol use: Not Currently    Comment: occassionally   Drug use: Never     Allergies   Coconut (cocos nucifera), Coconut oil, Coconut fatty acid, Ketorolac, Methocarbamol, Morphine, Morphine and codeine, Morphine sulfate, Toradol [ketorolac tromethamine], Ibuprofen, Meloxicam, Penicillins, and Sumatriptan   Review of Systems Review of Systems  Respiratory:  Positive for cough.   Musculoskeletal:  Positive for back pain.   Per HPI  Physical Exam Triage Vital Signs ED Triage Vitals  Encounter Vitals Group     BP 05/26/23 1105 122/81     Systolic BP Percentile --      Diastolic BP Percentile --      Pulse Rate 05/26/23 1105 (!) 101     Resp 05/26/23 1105 18     Temp 05/26/23 1105 98.3 F (36.8 C)     Temp Source 05/26/23 1105 Oral     SpO2 05/26/23 1105 95 %     Weight --      Height --      Head Circumference --      Peak Flow --      Pain Score 05/26/23 1103 5     Pain Loc --      Pain Education --      Exclude from Growth Chart --    No data found.  Updated  Vital  Signs BP 122/81 (BP Location: Left Arm)   Pulse (!) 101   Temp 98.3 F (36.8 C) (Oral)   Resp 18   SpO2 95%   Visual Acuity Right Eye Distance:   Left Eye Distance:   Bilateral Distance:    Right Eye Near:   Left Eye Near:    Bilateral Near:     Physical Exam Vitals and nursing note reviewed.  Constitutional:      General: She is awake. She is not in acute distress.    Appearance: Normal appearance. She is well-developed and well-groomed. She is not ill-appearing.  HENT:     Right Ear: Tympanic membrane, ear canal and external ear normal.     Left Ear: Tympanic membrane, ear canal and external ear normal.     Nose: Congestion and rhinorrhea present.     Mouth/Throat:     Mouth: Mucous membranes are moist.     Pharynx: Posterior oropharyngeal erythema present. No oropharyngeal exudate.  Cardiovascular:     Rate and Rhythm: Normal rate and regular rhythm.  Pulmonary:     Effort: Pulmonary effort is normal.     Breath sounds: Decreased air movement present. Wheezing present.  Musculoskeletal:        General: Normal range of motion.  Skin:    General: Skin is warm and dry.  Neurological:     Mental Status: She is alert.  Psychiatric:        Behavior: Behavior is cooperative.      UC Treatments / Results  Labs (all labs ordered are listed, but only abnormal results are displayed) Labs Reviewed  D-DIMER, QUANTITATIVE    EKG   Radiology DG Chest 2 View Result Date: 05/26/2023 CLINICAL DATA:  Cough.  Productive cough for 3 weeks. EXAM: CHEST - 2 VIEW COMPARISON:  01/09/2021 FINDINGS: The cardiomediastinal contours are normal. Mild bronchial thickening. Minor subsegmental atelectasis in the lingula and left lower lobe. Pulmonary vasculature is normal. No consolidation, pleural effusion, or pneumothorax. No acute osseous abnormalities are seen. IMPRESSION: Bronchial thickening without focal airspace disease. Electronically Signed   By: Narda Rutherford M.D.   On:  05/26/2023 12:10    Procedures Procedures (including critical care time)  Medications Ordered in UC Medications  ipratropium-albuterol (DUONEB) 0.5-2.5 (3) MG/3ML nebulizer solution 3 mL (3 mLs Nebulization Given 05/26/23 1137)  albuterol (PROVENTIL) (2.5 MG/3ML) 0.083% nebulizer solution 2.5 mg (2.5 mg Nebulization Given 05/26/23 1213)    Initial Impression / Assessment and Plan / UC Course  I have reviewed the triage vital signs and the nursing notes.  Pertinent labs & imaging results that were available during my care of the patient were reviewed by me and considered in my medical decision making (see chart for details).     Patient presented with 3-week history of productive cough, congestion, shortness of breath, chest tightness, fever, chills, and bodyaches.  Patient reports about 3 days ago she began having hoarseness and bilateral upper back pain with inspiration.  History of asthma.  Upon assessment congestion and rhinorrhea are present, mild erythema noted to pharynx.  Decreased air movement and expiratory wheezing noted. Duoneb given with some relief of wheezing, additional albuterol neb given with relief of wheezing and symptomatic relief. Chest X-ray did not reveal pneumonia, but did reveal bronchial thickening without focal airspace disease.   Ordered D-Dimer as a precaution. Wells criteria 1.5 due to mild tachycardia. Patient reported recent airplane travel as well.   Prescribed Azithromycin and prednisone for persistent  upper respiratory infection. Prescribed more Tessalon and Promethazine DM for cough due to running out. Discussed OTC medication for symptoms. Discussed follow-up, return, and ER precautions.  Final Clinical Impressions(s) / UC Diagnoses   Final diagnoses:  Acute cough  Moderate persistent asthma with acute exacerbation  Acute upper respiratory infection  Upper back pain     Discharge Instructions      Your chest X-ray was negative for pneumonia.  Start taking azithromycin by taking 2 tablets today and 1 tablet on the remaining 4 days.  Take prednisone once daily for 5 days.  I have also prescribed promethazine DM cough syrup that you can take at night for cough.  This can make you drowsy so do not take while driving.  Otherwise alternate between Tylenol and ibuprofen as needed for pain and fever.  I also recommend taking Mucinex to help with cough and congestion.   As discussed we have drawn a lab called D-Dimer which can possibly indicate the presence of a blood clot. This should result today and if it is elevated I will call and recommend that you be seen in the ER to rule out the possibility of a blood clot in your lung. Return here if symptoms persist.  If you develop worsening shortness of breath, chest pain, or passing out please seek immediate medical treatment in the ER.       ED Prescriptions     Medication Sig Dispense Auth. Provider   azithromycin (ZITHROMAX Z-PAK) 250 MG tablet Take 2 pills (500mg ) first day and one pill (250mg ) the remaining 4 days. 6 tablet Wynonia Lawman A, NP   predniSONE (DELTASONE) 20 MG tablet Take 2 tablets (40 mg total) by mouth daily for 5 days. 10 tablet Wynonia Lawman A, NP   benzonatate (TESSALON) 100 MG capsule Take 1 capsule (100 mg total) by mouth every 8 (eight) hours. 21 capsule Wynonia Lawman A, NP   promethazine-dextromethorphan (PROMETHAZINE-DM) 6.25-15 MG/5ML syrup Take 5 mLs by mouth at bedtime as needed for cough. 118 mL Wynonia Lawman A, NP      PDMP not reviewed this encounter.   Wynonia Lawman A, NP 05/26/23 1256

## 2023-09-10 ENCOUNTER — Telehealth: Payer: Self-pay | Admitting: Neurology

## 2023-09-10 NOTE — Telephone Encounter (Signed)
 Received FMLA forms from [employer] for pt. Patient paid form fee. Gave to POD 2 for review

## 2023-09-11 DIAGNOSIS — Z0289 Encounter for other administrative examinations: Secondary | ICD-10-CM

## 2023-09-13 NOTE — Telephone Encounter (Signed)
 Form completed and faxed. Copy emailed to the patient per her request. Copy and original given to medical records for filing.

## 2023-09-26 ENCOUNTER — Telehealth: Payer: Self-pay

## 2023-09-26 NOTE — Telephone Encounter (Signed)
 Cpap order faxed to [facility]

## 2023-10-18 IMAGING — US US RENAL
1 series · 14 of 25 positions shown · non-contrast
Comparison: Abdominopelvic CT 02/06/2020

CLINICAL DATA: History of kidney stones.

EXAM:
RENAL / URINARY TRACT ULTRASOUND COMPLETE

[Series 1: us renal · 0.23mm/px · 14 of 30 slices shown]
[im 1/30]
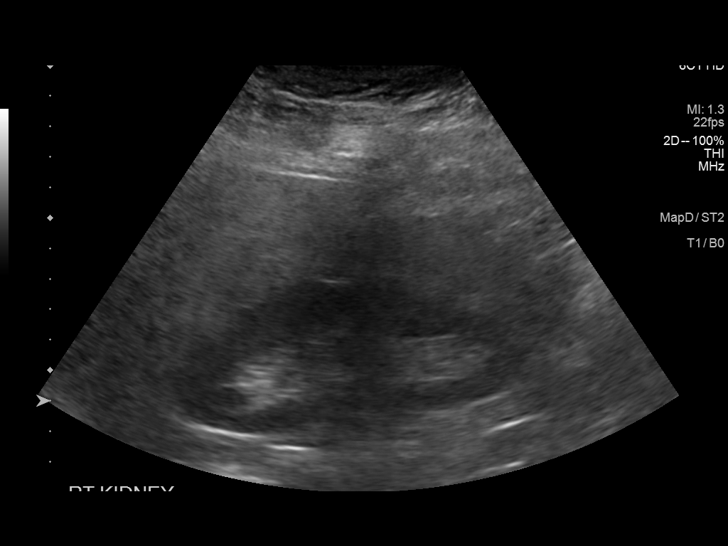
[im 3/30]
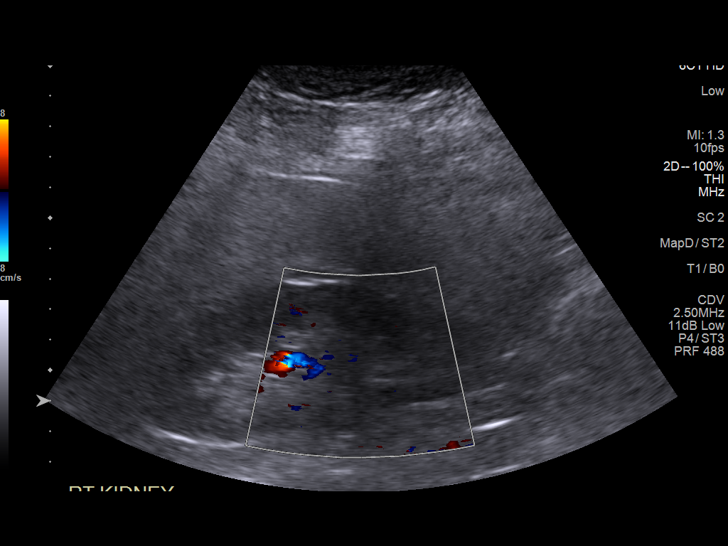
[im 5/30]
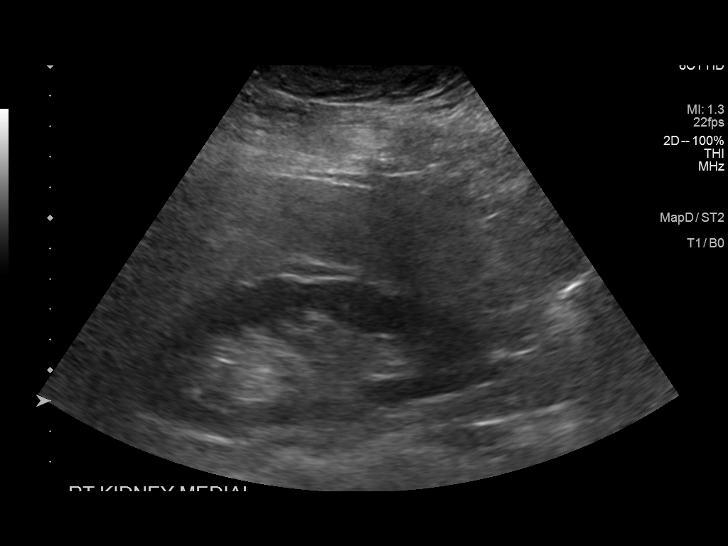
[im 8/30]
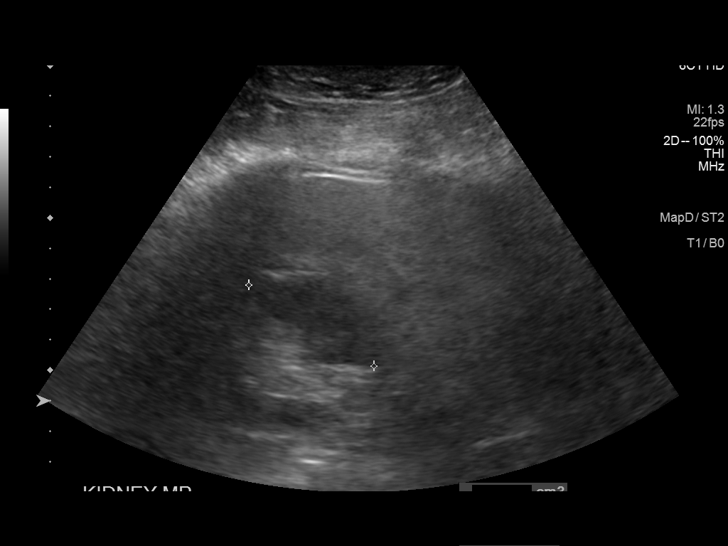
[im 10/30]
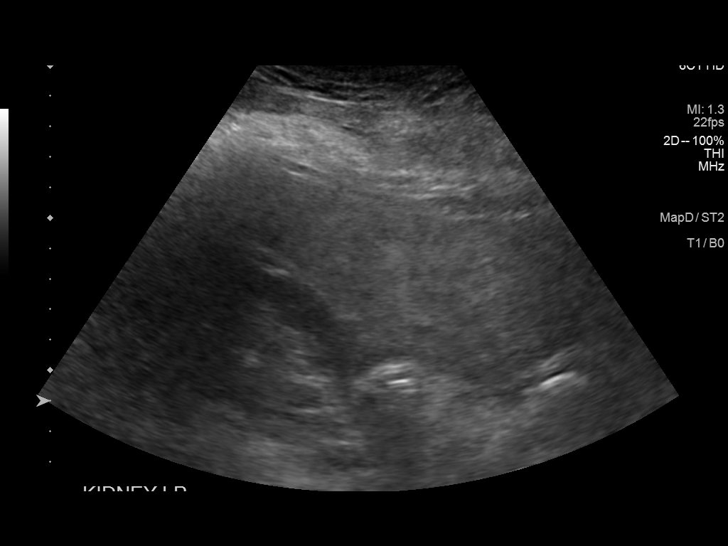
[im 11/30]
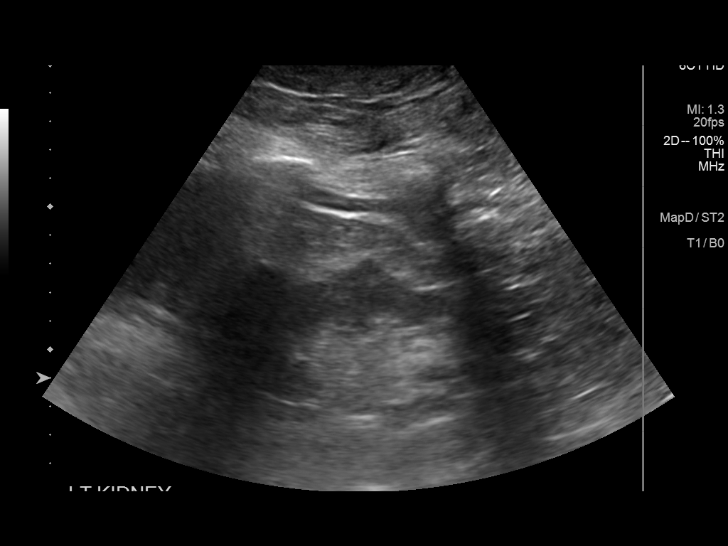
[im 14/30]
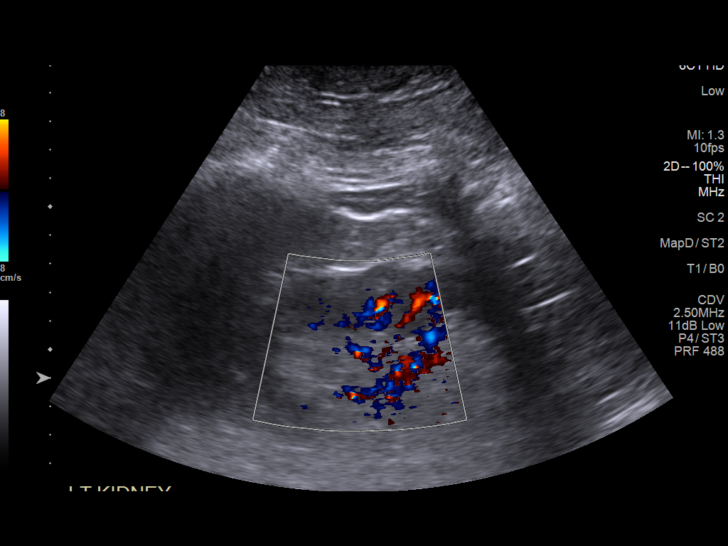
[im 16/30]
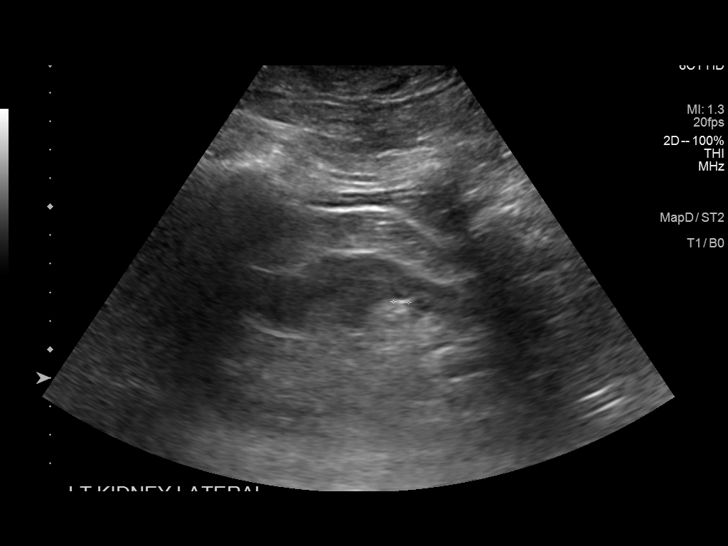
[im 19/30]
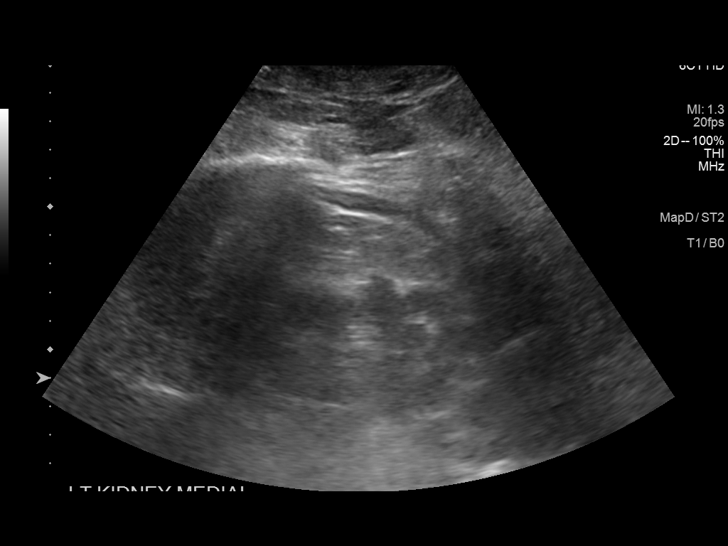
[im 20/30]
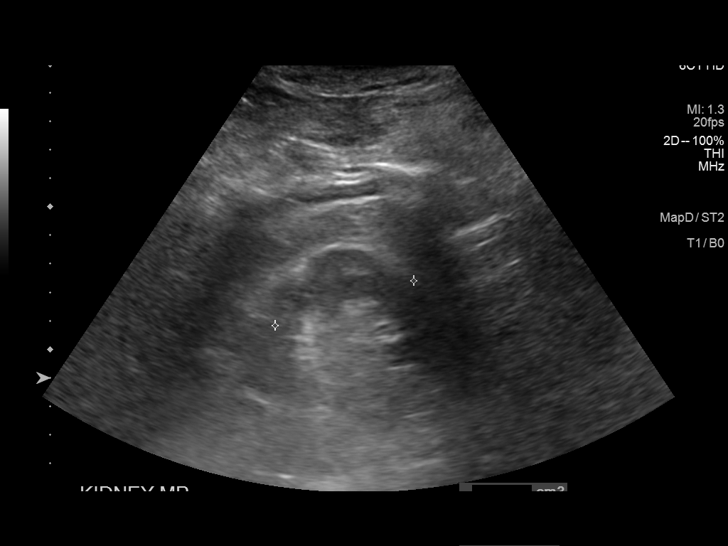
[im 22/30]
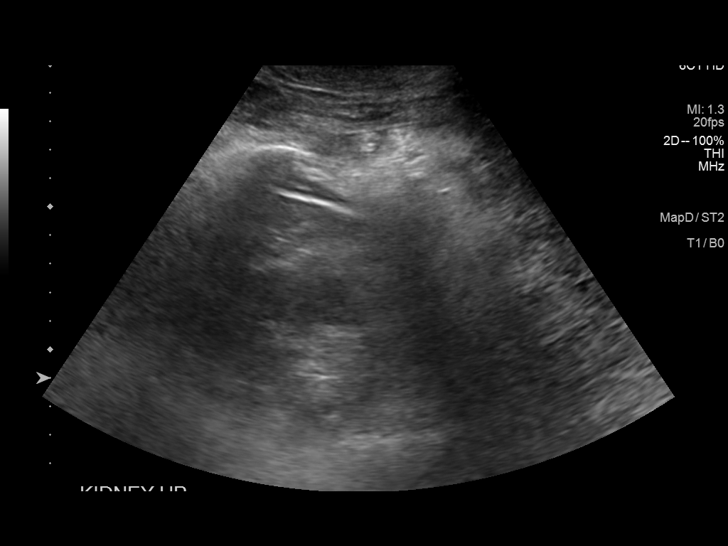
[im 25/30]
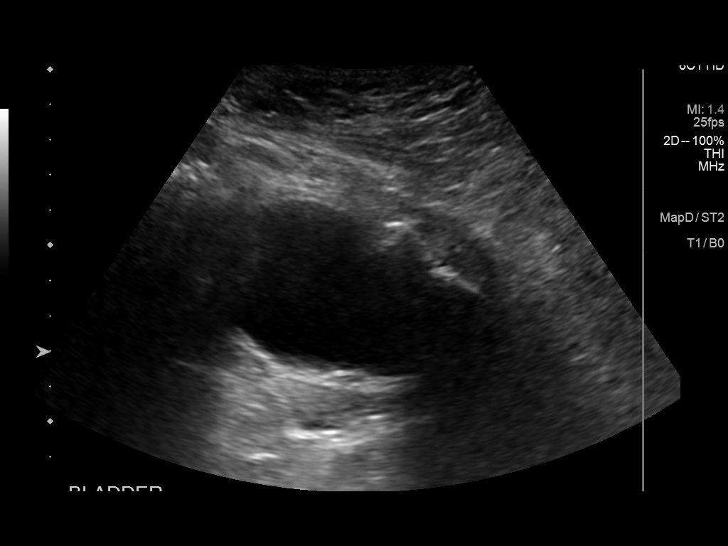
[im 27/30]
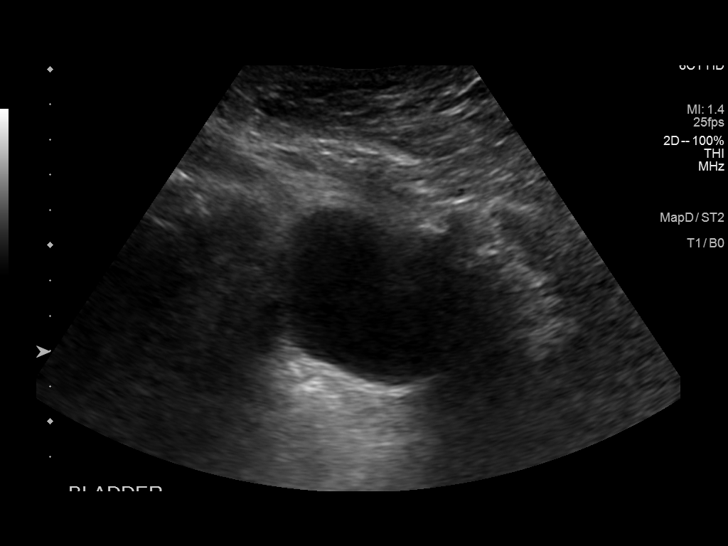
[im 30/30]
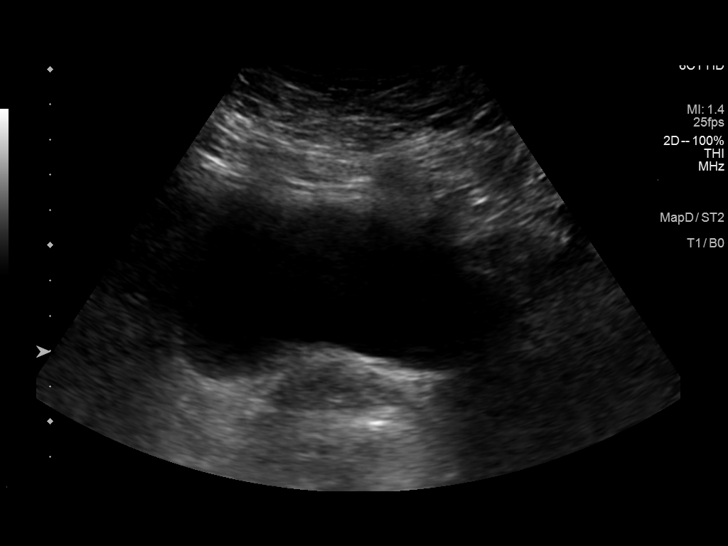

[14 of 25 positions shown; findings below may reference images not displayed]

FINDINGS: Sensitivity is diminished due to soft tissue attenuation from body
habitus.

Right Kidney:

Renal measurements: 11.6 x 3.9 x 4.8 cm = volume: 118 mL. Normal
parenchymal echogenicity. No hydronephrosis. No visualized stone or
focal lesion.

Left Kidney:

Renal measurements: 11.1 x 5.1 x 5.1 cm = volume: 148 mL. Normal
parenchymal echogenicity. No hydronephrosis. 7 mm stone in the lower
left kidney. No evidence of focal renal lesion.

Bladder:

Appears normal for degree of bladder distention.

Other:

None.
IMPRESSION: Left renal calculus.  No hydronephrosis.

## 2023-10-18 IMAGING — MG MM DIGITAL SCREENING BILAT W/ TOMO AND CAD
8 series · 8 of 24 positions shown · non-contrast
Comparison: Previous exam(s).

ACR Breast Density Category a: The breast tissue is almost entirely
fatty.

CLINICAL DATA: Screening.

EXAM:
DIGITAL SCREENING BILATERAL MAMMOGRAM WITH TOMOSYNTHESIS AND CAD
TECHNIQUE: Bilateral screening digital craniocaudal and mediolateral oblique
mammograms were obtained. Bilateral screening digital breast
tomosynthesis was performed. The images were evaluated with
computer-aided detection.

[L MLO synth-2D]
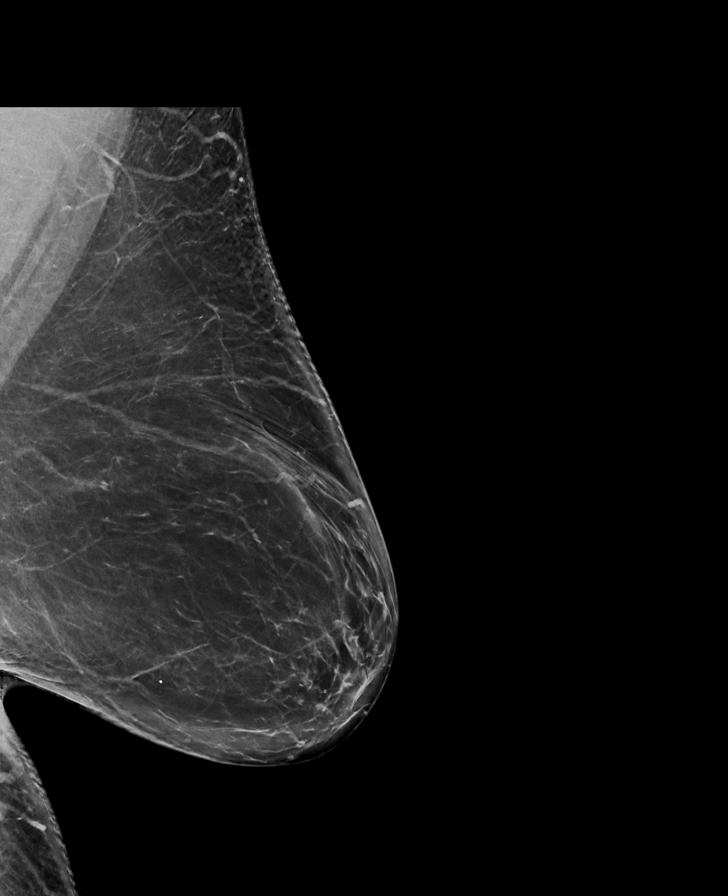

[R CC synth-2D]
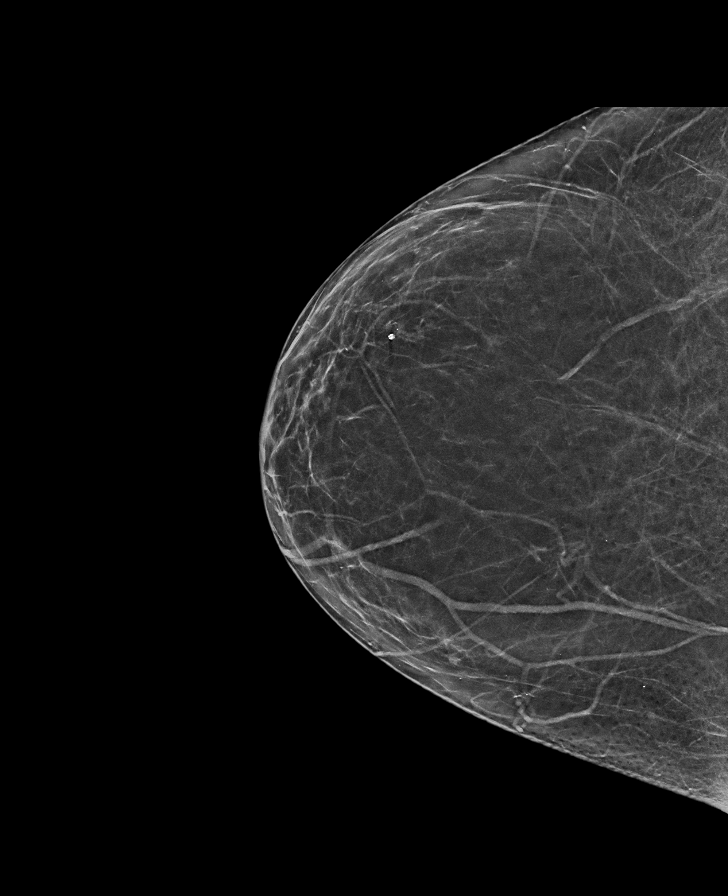

[R MLO synth-2D]
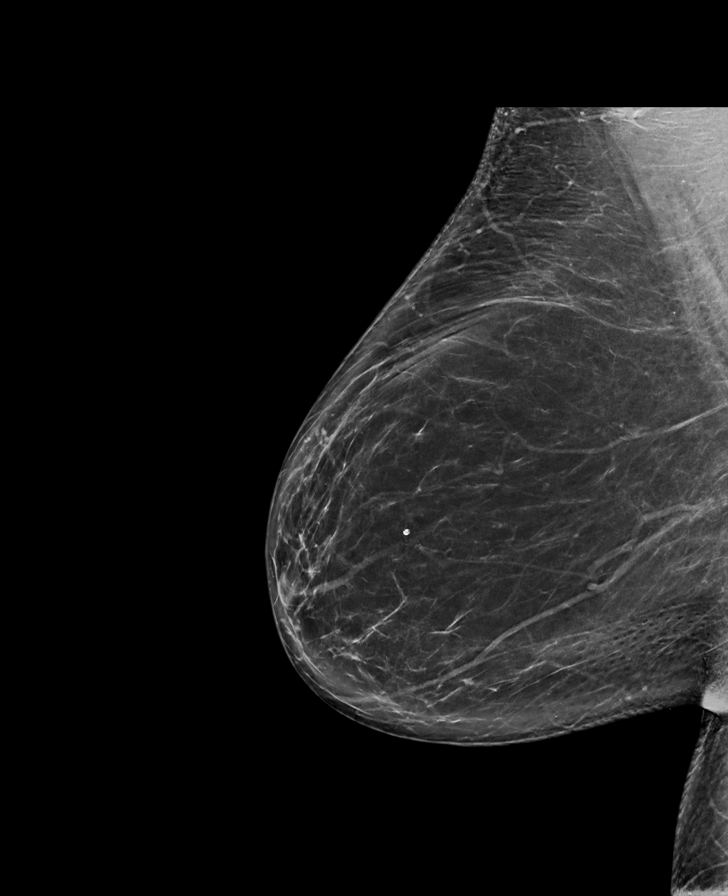

[L CC synth-2D]
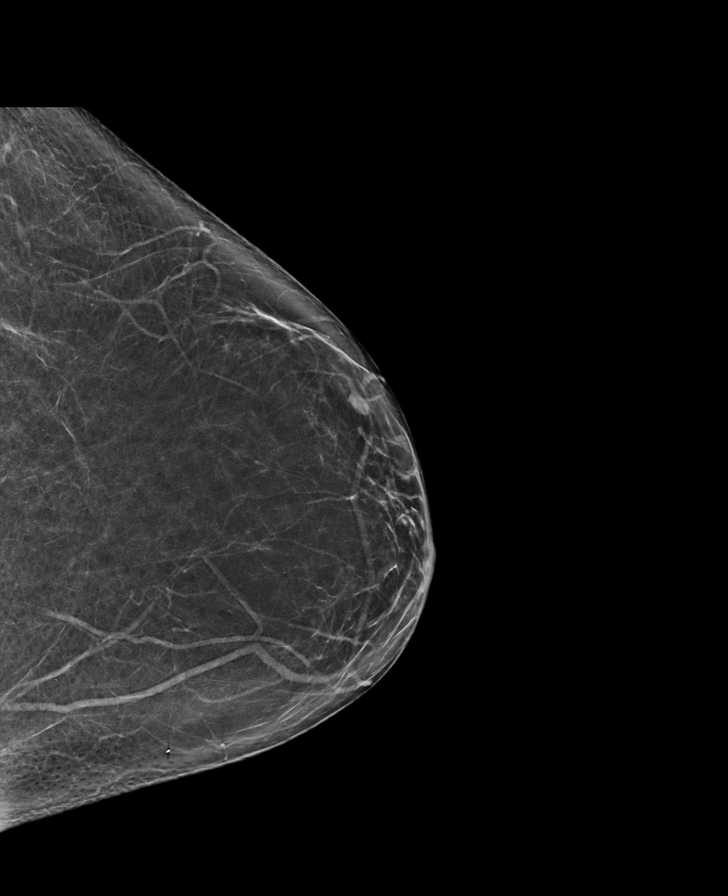

[R CC tomo · tomo slice 35/70.0]
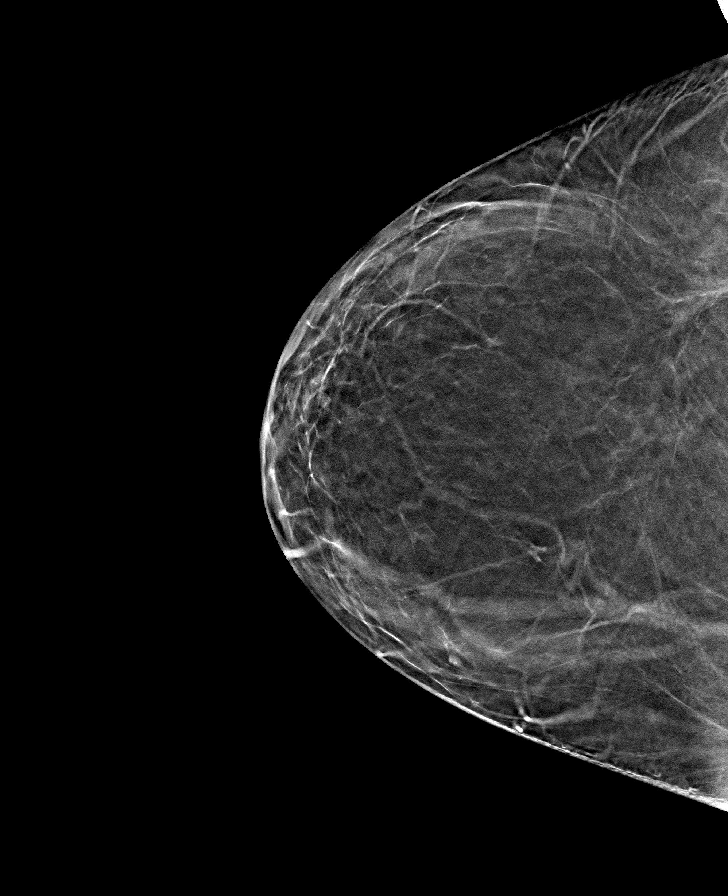

[R MLO tomo · tomo slice 45/88.0]
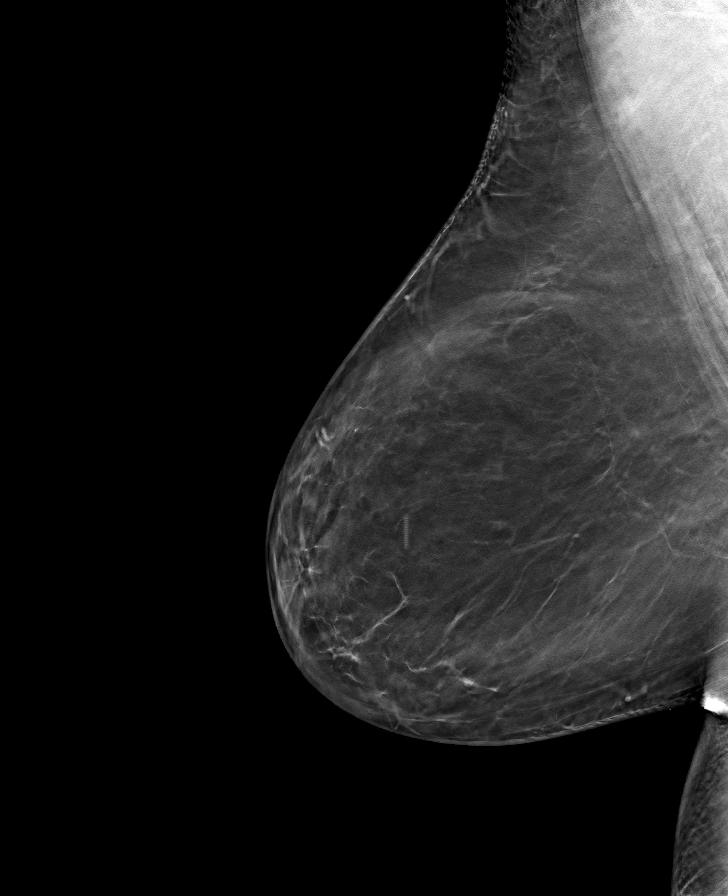

[L MLO tomo · tomo slice 46/91.0]
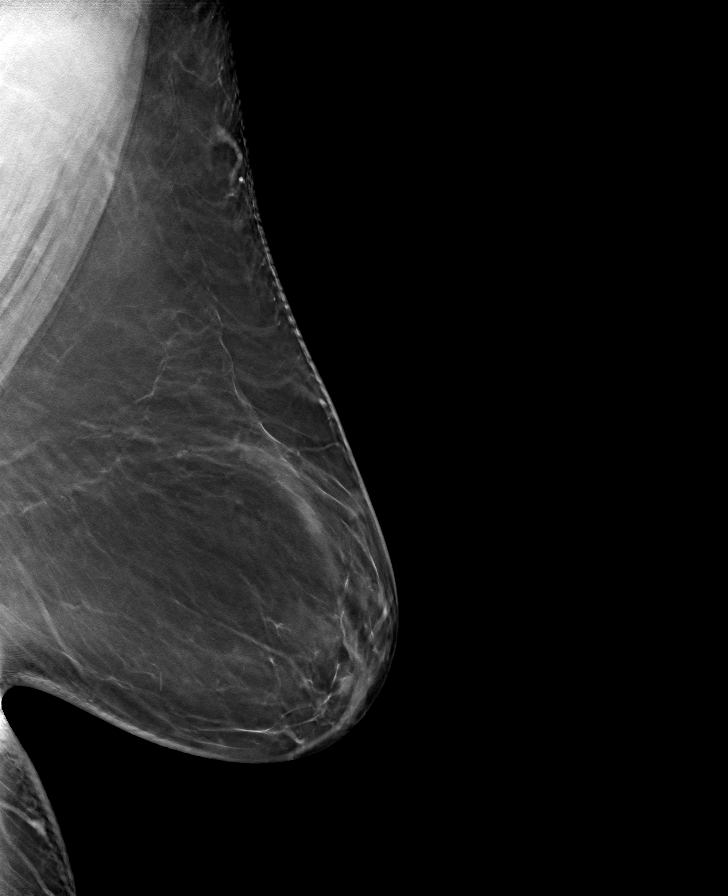

[L CC tomo · tomo slice 36/71.0]
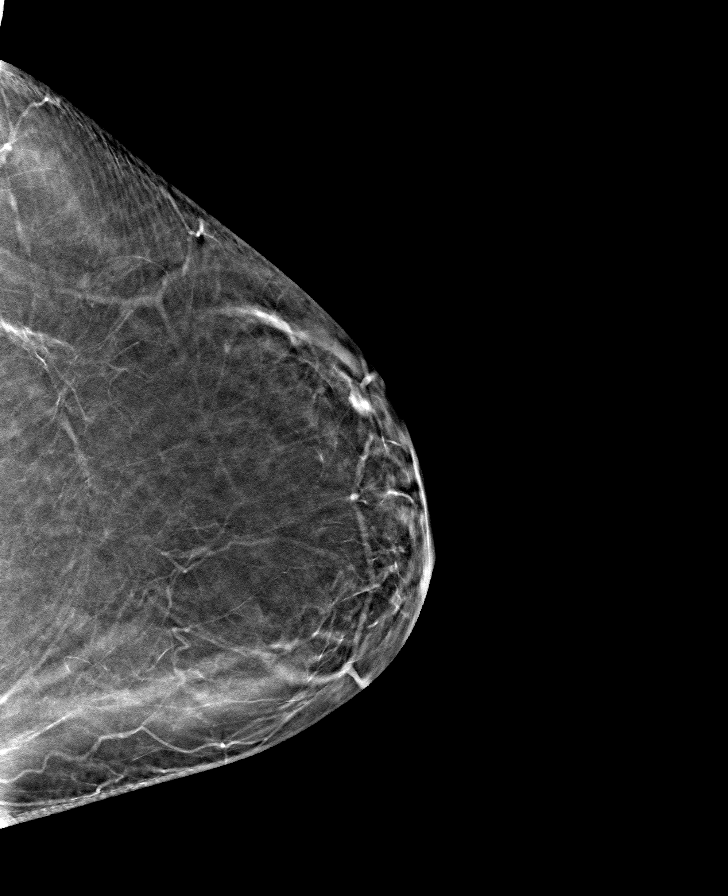

[8 of 24 positions shown; findings below may reference images not displayed]

FINDINGS: There are no findings suspicious for malignancy.
IMPRESSION: No mammographic evidence of malignancy. A result letter of this
screening mammogram will be mailed directly to the patient.

RECOMMENDATION:
Screening mammogram in one year. (Code:0E-3-N98)

BI-RADS CATEGORY  1: Negative.

## 2023-11-16 ENCOUNTER — Other Ambulatory Visit (HOSPITAL_COMMUNITY): Payer: Self-pay

## 2023-11-16 ENCOUNTER — Telehealth: Payer: Self-pay

## 2023-11-16 NOTE — Telephone Encounter (Signed)
 Pharmacy Patient Advocate Encounter   Received notification from Fax that prior authorization for Nurtec 75mg  Tablet is required/requested.   Insurance verification completed.   The patient is insured through [facility] .   Per test claim: PA required; PA submitted to above mentioned insurance via Latent Key/confirmation #/EOC [id number] Status is pending

## 2023-11-21 NOTE — Telephone Encounter (Signed)
 Pharmacy Patient Advocate Encounter  Received notification from [employer] that Prior Authorization for Nurtec has been APPROVED from 11/20/2023 to 11/19/2024   PA #/Case ID/Reference #: [id number]

## 2023-12-09 ENCOUNTER — Encounter (HOSPITAL_COMMUNITY): Payer: Self-pay

## 2023-12-09 ENCOUNTER — Other Ambulatory Visit: Payer: Self-pay

## 2023-12-09 ENCOUNTER — Emergency Department (EMERGENCY_DEPARTMENT_HOSPITAL)

## 2023-12-09 ENCOUNTER — Emergency Department (HOSPITAL_COMMUNITY)
Admission: EM | Admit: 2023-12-09 | Discharge: 2023-12-09 | Disposition: A | Discharge status: Home / Self Care | Attending: Emergency Medicine | Admitting: Emergency Medicine

## 2023-12-09 DIAGNOSIS — Z79899 Other long term (current) drug therapy: Secondary | ICD-10-CM | POA: Diagnosis not present

## 2023-12-09 DIAGNOSIS — I10 Essential (primary) hypertension: Secondary | ICD-10-CM | POA: Diagnosis not present

## 2023-12-09 DIAGNOSIS — Z794 Long term (current) use of insulin: Secondary | ICD-10-CM | POA: Insufficient documentation

## 2023-12-09 DIAGNOSIS — J45909 Unspecified asthma, uncomplicated: Secondary | ICD-10-CM | POA: Diagnosis not present

## 2023-12-09 DIAGNOSIS — M79661 Pain in right lower leg: Secondary | ICD-10-CM

## 2023-12-09 DIAGNOSIS — M7631 Iliotibial band syndrome, right leg: Secondary | ICD-10-CM | POA: Diagnosis not present

## 2023-12-09 DIAGNOSIS — Z7984 Long term (current) use of oral hypoglycemic drugs: Secondary | ICD-10-CM | POA: Insufficient documentation

## 2023-12-09 DIAGNOSIS — E1140 Type 2 diabetes mellitus with diabetic neuropathy, unspecified: Secondary | ICD-10-CM | POA: Diagnosis not present

## 2023-12-09 DIAGNOSIS — M79604 Pain in right leg: Secondary | ICD-10-CM | POA: Diagnosis present

## 2023-12-09 LAB — COMPREHENSIVE METABOLIC PANEL WITH GFR
ALT: 12 U/L (ref 0–44)
AST: 20 U/L (ref 15–41)
Albumin: 3.4 g/dL — ABNORMAL LOW (ref 3.5–5.0)
Alkaline Phosphatase: 86 U/L (ref 38–126)
Anion gap: 12 (ref 5–15)
BUN: 15 mg/dL (ref 6–20)
CO2: 24 mmol/L (ref 22–32)
Calcium: 9.1 mg/dL (ref 8.9–10.3)
Chloride: 103 mmol/L (ref 98–111)
Creatinine, Ser: 1.1 mg/dL — ABNORMAL HIGH (ref 0.44–1.00)
GFR, Estimated: >60 mL/min (ref >60)
Glucose, Bld: 152 mg/dL — ABNORMAL HIGH (ref 70–99)
Potassium: 4.1 mmol/L (ref 3.5–5.1)
Sodium: 139 mmol/L (ref 135–145)
Total Bilirubin: 0.5 mg/dL (ref 0.0–1.2)
Total Protein: 6.8 g/dL (ref 6.5–8.1)

## 2023-12-09 LAB — CBC
HCT: 38.3 % (ref 36.0–46.0)
Hemoglobin: 12 g/dL (ref 12.0–15.0)
MCH: 27.8 pg (ref 26.0–34.0)
MCHC: 31.3 g/dL (ref 30.0–36.0)
MCV: 88.9 fL (ref 80.0–100.0)
Platelets: 344 K/uL (ref 150–400)
RBC: 4.31 MIL/uL (ref 3.87–5.11)
RDW: 13.4 % (ref 11.5–15.5)
WBC: 6.5 K/uL (ref 4.0–10.5)
nRBC: 0 % (ref 0.0–0.2)

## 2023-12-09 MED ORDER — OXYCODONE HCL 5 MG PO TABS: 5.0000 mg | ORAL_TABLET | ORAL | 0 refills | Status: DC | PRN

## 2023-12-09 MED ORDER — CYCLOBENZAPRINE HCL 10 MG PO TABS: 10.0000 mg | ORAL_TABLET | Freq: Two times a day (BID) | ORAL | 0 refills | Status: AC | PRN

## 2023-12-09 NOTE — ED Triage Notes (Signed)
 Pt c.o right thigh pain for 3 months, pt developed swelling 2 weeks ago and a fever last night.

## 2023-12-09 NOTE — Progress Notes (Signed)
 RLE venous duplex has been completed.  Preliminary results given to Sarah Smoot, PA-C.   Results can be found under chart review under CV PROC. 12/09/2023 2:03 PM Aspyn Warnke RVT, RDMS

## 2023-12-09 NOTE — Discharge Instructions (Addendum)
 It was a pleasure caring for you today in the emergency department.  You can purchase topical lidocaine  patches at your local pharmacy as they are available over-the-counter that may be helpful.  You can use these in conjunction with your current medications.  Please follow-up with your PCP for recheck in the next week or so to ensure improvement.  Please return to the emergency department for any worsening or worrisome symptoms.

## 2023-12-09 NOTE — ED Provider Triage Note (Signed)
 Emergency Medicine Provider Triage Evaluation Note  Lisa Kennedy , a 49 y.o. female  was evaluated in triage.  Pt complains of right leg pain. Sx x 3  months. Reports initially thought it was pressure from the chair she sits in at work, however changed chairs and is still having pain. Reports pain to right lateral thigh. No skin changes. No back or hip pain.  Review of Systems  Positive:  Negative:   Physical Exam  BP (!) 145/64 (BP Location: Right Arm)   Pulse 90   Temp 99 F (37.2 C)   Resp 18   Ht 5' 4.5 (1.638 m)   Wt 121.6 kg   SpO2 93%   BMI 45.29 kg/m  Gen:   Awake, no distress   Resp:  Normal effort  MSK:   Moves extremities without difficulty  Other:  TTP right lateral mid thigh. DP PT pulses palpable. No back or hip TTP. No bony tenderness.   Medical Decision Making  Medically screening exam initiated at 1:08 PM.  Appropriate orders placed.  Lisa Kennedy was informed that the remainder of the evaluation will be completed by another provider, this initial triage assessment does not replace that evaluation, and the importance of remaining in the ED until their evaluation is complete.     Nora Lauraine LABOR, PA-C 12/09/23 1310

## 2023-12-09 NOTE — ED Provider Notes (Signed)
 [place] [facility] Provider Note  CSN: [id number] Arrival date & time: 12/09/23 1214  Chief Complaint(s) Leg Pain  HPI Lisa Kennedy is a 48 y.o. female with past medical history as below, significant for type II DM HLD, hypertension, migraine, OSA on CPAP, obesity who presents to the ED with complaint of right leg pain  Patient reports right leg pain over the past 3 months.  Feels it is gradually worsened.  Felt it was secondary to her office chair that would constantly rub the affected area on her leg, she switched to a different office chair that did not rub her leg in the same area and the pain persisted.  She also fell around 2 months ago and hit her leg on furniture.  She has been amatory that difficulty, she has been taking Tylenol  and gabapentin which provided mild relief of her symptoms.  Symptoms are localized to the lateral aspect of her right thigh, no symptoms distal.  No abdominal pain nausea or vomiting, fevers or chills, no low back pain, no headache.  Past Medical History Past Medical History:  Diagnosis Date   Asthma    Blood transfusion without reported diagnosis 03/2008   During Hysterectomy    Carpal tunnel syndrome    Diabetes mellitus without complication (HCC)    type 2   Fluid collection of middle ear    Gout    Herniated disc, cervical    Lumbar as well   History of vitamin D deficiency    Hyperlipidemia    Hypertension    Irregular heartbeat    Migraine    Neuropathy    Sciatica    Patient Active Problem List   Diagnosis Date Noted   Chronic migraine w/o aura, not intractable, w/o stat migr 09/13/2022   OSA on CPAP 09/13/2022   Screening breast examination 01/28/2019   Breast pain 01/28/2019   Home Medication(s) Prior to Admission medications   Medication Sig Start Date End Date Taking? Authorizing Provider  cyclobenzaprine  (FLEXERIL ) 10 MG tablet Take 1 tablet (10 mg total) by mouth 2 (two) times daily as  needed for muscle spasms. 12/09/23  Yes Elnor Jayson LABOR, DO  oxyCODONE  (ROXICODONE ) 5 MG immediate release tablet Take 1 tablet (5 mg total) by mouth every 4 (four) hours as needed for severe pain (pain score 7-10). 12/09/23  Yes Elnor Jayson A, DO  albuterol  (VENTOLIN  HFA) 108 (90 Base) MCG/ACT inhaler Inhale into the lungs. 02/24/21   [provider]  allopurinol (ZYLOPRIM) 100 MG tablet Take 100 mg by mouth daily.    [provider]  atorvastatin (LIPITOR) 40 MG tablet Take 40 mg by mouth daily.    [provider]  azithromycin  (ZITHROMAX  Z-PAK) 250 MG tablet Take 2 pills (500mg ) first day and one pill (250mg ) the remaining 4 days. 05/26/23   Johnie Flaming A, NP  benzonatate  (TESSALON ) 100 MG capsule Take 1 capsule (100 mg total) by mouth every 8 (eight) hours. 05/26/23   Johnie Flaming A, NP  cetirizine (ZYRTEC) 10 MG tablet Take 10 mg by mouth daily.    [provider]  Cholecalciferol (VITAMIN D3) 1.25 MG (50000 UT) CAPS Take 1 capsule by mouth once a week. 10/01/21   [provider]  famotidine  (PEPCID ) 40 MG tablet Take 40 mg by mouth 2 (two) times daily.    [provider]  gabapentin (NEURONTIN) 300 MG capsule Take 300 mg by mouth 4 (four) times daily.  [provider]  HUMALOG KWIKPEN 100 UNIT/ML KwikPen  12/24/20   [provider]  insulin glargine (LANTUS) 100 UNIT/ML injection Inject 40 Units into the skin at bedtime.    [provider]  lisinopril-hydrochlorothiazide (ZESTORETIC) 20-12.5 MG tablet Take 1 tablet by mouth 2 (two) times daily.    [provider]  metFORMIN (GLUCOPHAGE) 1000 MG tablet Take 1,000 mg by mouth 2 (two) times daily with a meal.    [provider]  metoprolol  tartrate (LOPRESSOR ) 25 MG tablet Take 25 mg by mouth 2 (two) times daily.    [provider]  montelukast (SINGULAIR) 10 MG tablet Take 10 mg by mouth daily.    [provider]   promethazine -dextromethorphan (PROMETHAZINE -DM) 6.25-15 MG/5ML syrup Take 5 mLs by mouth at bedtime as needed for cough. 05/26/23   Johnie Flaming A, NP  Rimegepant Sulfate (NURTEC) 75 MG TBDP Take 1 tablet (75 mg total) by mouth every other day. 02/27/23   Gayland Lauraine PARAS, NP  SYMBICORT 160-4.5 MCG/ACT inhaler Inhale 2 puffs into the lungs 2 (two) times daily.    [provider]                                                                                                                                    Past Surgical History Past Surgical History:  Procedure Laterality Date   ABDOMINAL HYSTERECTOMY  2010   APPENDECTOMY  2010   CARPAL TUNNEL RELEASE  2008   CHOLECYSTECTOMY  1995   TRIGGER FINGER RELEASE     x 4   TUBAL LIGATION  1997   Family History Family History  Problem Relation Age of Onset   Hypertension Mother    Hypokalemia Mother    Gout Mother    Kidney disease Mother    Heart murmur Mother    Mental illness Father    Asthma Sister    Asthma Brother    Sleep apnea Paternal Aunt    Stroke Maternal Grandmother    Breast cancer Maternal Grandmother    Colon cancer Maternal Grandfather    Sleep apnea Daughter    Diabetes Maternal Great-grandmother     Social History Social History   Tobacco Use   Smoking status: Never   Smokeless tobacco: Never  Vaping Use   Vaping status: Never Used  Substance Use Topics   Alcohol use: Not Currently    Comment: occassionally   Drug use: Never   Allergies Coconut (cocos nucifera), Coconut oil, Coconut fatty acid, Ketorolac, Methocarbamol , Morphine, Morphine and codeine, Morphine sulfate, Toradol [ketorolac tromethamine], Ibuprofen, Meloxicam, Penicillins, and Sumatriptan  Review of Systems A thorough review of systems was obtained and all systems are negative except as noted in the HPI and PMH.   Physical Exam Vital Signs  I have reviewed the triage vital signs BP (!) 144/120 (BP Location: Right Wrist)    Pulse 82   Temp 98.3 F (  36.8 C) (Oral)   Resp 18   Ht 5' 4.5 (1.638 m)   Wt 121.6 kg   SpO2 100%   BMI 45.29 kg/m  Physical Exam Vitals and nursing note reviewed.  Constitutional:      General: She is not in acute distress.    Appearance: Normal appearance. She is well-developed. She is not ill-appearing.  HENT:     Head: Normocephalic and atraumatic.     Right Ear: External ear normal.     Left Ear: External ear normal.     Nose: Nose normal.     Mouth/Throat:     Mouth: Mucous membranes are moist.  Eyes:     General: No scleral icterus.       Right eye: No discharge.        Left eye: No discharge.  Cardiovascular:     Rate and Rhythm: Normal rate.  Pulmonary:     Effort: Pulmonary effort is normal. No respiratory distress.     Breath sounds: No stridor.  Abdominal:     General: Abdomen is flat. There is no distension.     Tenderness: There is no guarding.  Musculoskeletal:        General: No deformity.     Cervical back: No rigidity.       Legs:     Comments: She has mild TTP along IT band on right thigh.  LE NVI.  Achilles, quadricep and patella tendons intact bilateral.  Sensation and strength intact lower extremities bilateral  Skin:    General: Skin is warm and dry.     Coloration: Skin is not cyanotic, jaundiced or pale.  Neurological:     Mental Status: She is alert.  Psychiatric:        Speech: Speech normal.        Behavior: Behavior normal. Behavior is cooperative.     ED Results and Treatments Labs (all labs ordered are listed, but only abnormal results are displayed) Labs Reviewed  COMPREHENSIVE METABOLIC PANEL WITH GFR - Abnormal; Notable for the following components:      Result Value   Glucose, Bld 152 (*)    Creatinine, Ser 1.10 (*)    Albumin 3.4 (*)    All other components within normal limits  CBC                                                                                                                          Radiology VAS  US  LOWER EXTREMITY VENOUS (DVT) (7a-7p) Result Date: 12/09/2023  Lower Venous DVT Study Patient Name:  ARYA LUTTRULL  Date of Exam:   12/09/2023 Medical Rec #: [id number]       Accession #:    [id number] Date of Birth: May 14, 1975       Patient Gender: F Patient Age:   66 years Exam Location:  [facility] Procedure:      VAS US  LOWER EXTREMITY VENOUS (DVT) Referring Phys: LAURAINE SMOOT --------------------------------------------------------------------------------  Indications: Right LATERAL radiating thigh pain. Other Indications: Patient sits at work and has had thigh pain for 3 months. Limitations: Poor ultrasound/tissue interface. Comparison Study: No previous exams Performing Technologist: Jody Hill RVT, RDMS  Examination Guidelines: A complete evaluation includes B-mode imaging, spectral Doppler, color Doppler, and power Doppler as needed of all accessible portions of each vessel. Bilateral testing is considered an integral part of a complete examination. Limited examinations for reoccurring indications may be performed as noted. The reflux portion of the exam is performed with the patient in reverse Trendelenburg.  +---------+---------------+---------+-----------+----------+--------------+ RIGHT    CompressibilityPhasicitySpontaneityPropertiesThrombus Aging +---------+---------------+---------+-----------+----------+--------------+ CFV      Full           Yes      Yes                                 +---------+---------------+---------+-----------+----------+--------------+ SFJ      Full                                                        +---------+---------------+---------+-----------+----------+--------------+ FV Prox  Full           Yes      Yes                                 +---------+---------------+---------+-----------+----------+--------------+ FV Mid   Full           Yes      Yes                                  +---------+---------------+---------+-----------+----------+--------------+ FV DistalFull           Yes      Yes                                 +---------+---------------+---------+-----------+----------+--------------+ PFV      Full                                                        +---------+---------------+---------+-----------+----------+--------------+ POP      Full           Yes      Yes                                 +---------+---------------+---------+-----------+----------+--------------+ PTV      Full                                                        +---------+---------------+---------+-----------+----------+--------------+ PERO     Full                                                        +---------+---------------+---------+-----------+----------+--------------+   +----+---------------+---------+-----------+----------+--------------+  LEFTCompressibilityPhasicitySpontaneityPropertiesThrombus Aging +----+---------------+---------+-----------+----------+--------------+ CFV Full           Yes      Yes                                 +----+---------------+---------+-----------+----------+--------------+    Summary: RIGHT: - There is no evidence of deep vein thrombosis in the lower extremity.  - No cystic structure found in the popliteal fossa.  LEFT: - No evidence of common femoral vein obstruction.   *See table(s) above for measurements and observations.    Preliminary     Pertinent labs & imaging results that were available during my care of the patient were reviewed by me and considered in my medical decision making (see MDM for details).  Medications Ordered in ED Medications - No data to display                                                                                                                                   Procedures Procedures  (including critical care time)  Medical Decision Making / ED Course    Medical  Decision Making:    ELORA WOLTER is a 48 y.o. female with past medical history as below, significant for type II DM HLD, hypertension, migraine, OSA on CPAP, obesity who presents to the ED with complaint of right leg pain. The complaint involves an extensive differential diagnosis and also carries with it a high risk of complications and morbidity.  Serious etiology was considered. Ddx includes but is not limited to: Sprain, strain, DVT, contusion, Eksir, etc.  Complete initial physical exam performed, notably the patient was in distress, resting comfortably.    Reviewed and confirmed nursing documentation for past medical history, family history, social history.  Vital signs reviewed.    Right thigh pain lateral> - TTP along IT band right thigh.  No external evidence of trauma, no swelling distally, LE NVI. - Labs and duplex ordered in triage are reassuring - Favor likely MSK etiology of her pain, encourage stretching, multimodal pain control, follow-up PCP   7:30 PM: Patient with likely MSK pain to her right thigh, likely IT band strain.  I have discussed the diagnosis/risks/treatment options with the patient.  Evaluation and diagnostic testing in the emergency department does not suggest an emergent condition requiring admission or immediate intervention beyond what has been performed at this time.  They will follow up with PCP. We also discussed returning to the ED immediately if new or worsening sx occur. We discussed the sx which are most concerning (e.g., sudden worsening pain, fever, inability to tolerate by mouth, leg weakness, leg numbness, etc.) that necessitate immediate return.    The patient appears reasonably screen and/or stabilized for discharge and I doubt any other medical condition or other [facility] requiring further screening, evaluation, or treatment in the ED at this time prior to  discharge.                        Additional history obtained: -Additional  history obtained from na -External records from outside source obtained and reviewed including: Chart review including previous notes, labs, imaging, consultation notes including  Pdmp Medications, allergy list   Lab Tests: -I ordered, reviewed, and interpreted labs.   The pertinent results include:   Labs Reviewed  COMPREHENSIVE METABOLIC PANEL WITH GFR - Abnormal; Notable for the following components:      Result Value   Glucose, Bld 152 (*)    Creatinine, Ser 1.10 (*)    Albumin 3.4 (*)    All other components within normal limits  CBC    Notable for labs stable  EKG   EKG Interpretation Date/Time:    Ventricular Rate:    PR Interval:    QRS Duration:    QT Interval:    QTC Calculation:   R Axis:      Text Interpretation:           Imaging Studies ordered: I ordered imaging studies including LE duplex I independently visualized the following imaging with scope of interpretation limited to determining acute life threatening conditions related to emergency care; findings noted above I agree with the radiologist interpretation If any imaging was obtained with contrast I closely monitored patient for any possible adverse reaction a/w contrast administration in the emergency department   Medicines ordered and prescription drug management: Meds ordered this encounter  Medications   oxyCODONE  (ROXICODONE ) 5 MG immediate release tablet    Sig: Take 1 tablet (5 mg total) by mouth every 4 (four) hours as needed for severe pain (pain score 7-10).    Dispense:  10 tablet    Refill:  0   cyclobenzaprine  (FLEXERIL ) 10 MG tablet    Sig: Take 1 tablet (10 mg total) by mouth 2 (two) times daily as needed for muscle spasms.    Dispense:  20 tablet    Refill:  0    -I have reviewed the patients home medicines and have made adjustments as needed   Consultations Obtained: na   Cardiac Monitoring: Continuous pulse oximetry interpreted by myself, 100% on RA.    Social  Determinants of Health:  Diagnosis or treatment significantly limited by social determinants of health: obesity   Reevaluation: After the interventions noted above, I reevaluated the patient and found that they have improved  Co morbidities that complicate the patient evaluation  Past Medical History:  Diagnosis Date   Asthma    Blood transfusion without reported diagnosis 03/2008   During Hysterectomy    Carpal tunnel syndrome    Diabetes mellitus without complication (HCC)    type 2   Fluid collection of middle ear    Gout    Herniated disc, cervical    Lumbar as well   History of vitamin D deficiency    Hyperlipidemia    Hypertension    Irregular heartbeat    Migraine    Neuropathy    Sciatica       Dispostion: Disposition decision including need for hospitalization was considered, and patient discharged from emergency department.    Final Clinical Impression(s) / ED Diagnoses Final diagnoses:  It band syndrome, right        Elnor Jayson LABOR, DO 12/09/23 1930

## 2023-12-09 NOTE — ED Triage Notes (Signed)
 Pt endorses right thigh pain and numbness for 3 months. Pt first thought it was due to arm chair at work from sitting all day but pain persisted and keeps her up at night. Hx of diabetes.

## 2024-02-28 ENCOUNTER — Encounter: Payer: Self-pay | Admitting: Neurology

## 2024-02-28 ENCOUNTER — Ambulatory Visit: Payer: 59 | Admitting: Neurology

## 2024-02-28 VITALS — BP 122/74 | HR 75 | Resp 15 | Ht 64.5 in

## 2024-02-28 DIAGNOSIS — G43709 Chronic migraine without aura, not intractable, without status migrainosus: Secondary | ICD-10-CM | POA: Diagnosis not present

## 2024-02-28 DIAGNOSIS — G4733 Obstructive sleep apnea (adult) (pediatric): Secondary | ICD-10-CM

## 2024-02-28 MED ORDER — NURTEC 75 MG PO TBDP: 75.0000 mg | ORAL_TABLET | ORAL | 6 refills | Status: AC

## 2024-02-28 NOTE — Patient Instructions (Signed)
 Great to see you today! Continue Nurtec for migraine management Continue CPAP usage minimum 4 hours nightly Continue current settings Continue to replace supplies routinely through DME Follow-up in 1 year or sooner if needed. Thanks!!

## 2024-02-28 NOTE — Progress Notes (Signed)
 Patient: Lisa Kennedy Date of Birth: 02-17-1976  Reason for Visit: Follow up History from: Patient Primary Neurologist: Buck  ASSESSMENT AND PLAN 48 y.o. year old female   1.  OSA on CPAP  -HST March 2023 moderate OSA AHI 25, CPAP titration Oct 2023.  -Setup April 2023 -Has very good CPAP compliance.  Has excellent CPAP benefit -Recommend continue nightly usage for minimum of 4 hours -Continue to replace supplies routinely through DME  2.  Chronic migraine headaches -Under good control, significant improvement with CPAP use -Continue Nurtec 75 mg tablet, can take up to every other day for migraine prevention -Follow-up in 1 year or sooner if needed VV  HISTORY OF PRESENT ILLNESS: Today 02/28/24 02/28/24 SS: CPAP report shows 100 % usage, > 4 hours 77%, 12 cm water, leak 33, AHI 0.5. Sometimes fall asleep reading without wearing CPAP, next day feels headache, chest tightness. Uses FFM. Slight leak, tightening mask hurts her nose. Migraines doing better, worse in summer is typical for her. Takes Nurtec PRN, will take 1-2 times a week with excellent benefit.   02/27/23 SS: Good CPAP compliance 83% > 4 hours. AHI 0.3, leak 14.9. Can tell a difference with CPAP, no longer has headache in the AM, sleeps through the night, more energy, rested in AM. Uses FFM, it does slip sometimes due to oily face.  1 migraine a month. Takes Nurtec as needed, works great, had to meet her deductible. Asthma has been bothering her. ESS 2. Has FMLA for migraines, works from home on computer.   Update 09/13/22 SS: She saw Dr. Rush October 2023 started Nurtec every other day for migraine prevention.  She follows with Dr. Buck for her CPAP.  They had an office visit in June 2023.  Had HST March 2023 showing moderate OSA her set up date was June 29, 2021.  She had O2 nadir of 74%.  Had overnight pulse oximetry June 2023 time below 88% was 1 hour 49 minutes.  Recommended she come in for proper titration study.   Study was completed 01/03/2022 due to oxygen desaturations on AutoPap.  Her CPAP was switched to set pressure 14 cm.  Pressure was reduced to 12 cmH2O for comfort.   She mentioned the Nurtec every other day was too expensive at the beginning of the year due to being $900 for her high deductible plan, may be less since has been paying on her deductible. Works for [employer] at home. Since starting on CPAP, migraines much improved, no more than 4 migraines a month, Nurtec works very well, takes 20 minutes. In the last 30 days, has clocked out of work early 2 afternoons due to migraine but didn't get an occurrence.  Has FMLA paperwork for us  to complete.  CPAP data 08/14/2022-09/12/22 showed usage 26/30 days at 87%, greater than 4 hours 20 days at 67%.  Average usage days used 5 hours 3 minutes.  Set pressure 12, EPR 3.  Leak 34.7.  AHI 0.3.  Using fullface mask.  A few nights she traveled and did not wear it.  Sometimes she will fall asleep without putting it on.  Occasionally notes the mask leaking if it gets malposition.  She tolerates the CPAP well.  Pressure is tolerable.  She is very pleased with benefit to migraines.  ESS remains elevated at 15.  HISTORY  12/27/21 Dr. Rush: 48 year old female with a history of recurrent sinusitis, HLD, DM, kidney stones, OSA on autoPAP who follows in clinic  for headaches.   At her last visit she was started on Nurtec for migraine rescue and referred to Sleep for OSA evaluation.   Interval History: She was found to have moderate OSA and was started on autoPAP. Headache severity has improved with use of the autoPAP, though she does continue to have 1-2 migraines per week. Notes that her migraines are typically worse around this time of year. Nurtec does help reduce her headache when she takes it for rescue. She tolerates this well without side effects.   Headache days per month: 8 Headache free days per month: 22   Current Headache Regimen: Preventative:  none Abortive: Nurtec 75 mg PRN     Prior Therapies                                  Imitrex - jittery, hives Nurtec 75 mg PRN Fioricet Tylenol  Robaxin  Gabapentin 300 mg four times a day Metoprolol  25 mg BID Topamax contraindicated due to kidney stones  REVIEW OF SYSTEMS: Out of a complete 14 system review of symptoms, the patient complains only of the following symptoms, and all other reviewed systems are negative.  See HPI  ALLERGIES: Allergies  Allergen Reactions   Coconut (Cocos Nucifera) Hives   Coconut Oil Rash   Cocamide    Coconut Fatty Acid    Ketorolac    Methocarbamol  Other (See Comments)    Upset stomach    Morphine Hives   Morphine And Codeine Hives   Morphine Sulfate    Toradol [Ketorolac Tromethamine] Hives    Tingling in tongue   Ibuprofen     Other reaction(s): GI Upset (intolerance)  Other Reaction(s): GI intolerance, GI Intolerance   Meloxicam     Other reaction(s): GI Upset (intolerance)  Other Reaction(s): GI Intolerance   Penicillins Hives and Rash    Did it involve swelling of the face/tongue/throat, SOB, or low BP? No  Did it involve sudden or severe rash/hives, skin peeling, or any reaction on the inside of your mouth or nose? Yes  Did you need to seek medical attention at a hospital or doctor's office? No  When did it last happen?     more than 5 yrs ago   If all above answers are "NO", may proceed with cephalosporin use.  Other reaction(s): Hives, Did it involve swelling of the face/tongue/throat, SOB, or low BP? No, Did it involve sudden or severe rash/hives, skin peeling, or any reaction on the inside of your mouth or nose? Yes, Did you need to seek medical attention at a hospital or doctor's office? No, When did it last happen?     more than 5 yrs ago , If all above answers are "NO", may proceed with cephalosporin use.   Sumatriptan Hives and Rash    HOME MEDICATIONS: Outpatient Medications Prior to Visit  Medication Sig  Dispense Refill   albuterol  (VENTOLIN  HFA) 108 (90 Base) MCG/ACT inhaler Inhale into the lungs.     allopurinol (ZYLOPRIM) 100 MG tablet Take 100 mg by mouth daily.     atorvastatin (LIPITOR) 40 MG tablet Take 40 mg by mouth daily.     cetirizine (ZYRTEC) 10 MG tablet Take 10 mg by mouth daily.     Cholecalciferol (VITAMIN D3) 1.25 MG (50000 UT) CAPS Take 1 capsule by mouth once a week.     cyclobenzaprine  (FLEXERIL ) 10 MG tablet Take 1 tablet (10 mg total) by mouth  2 (two) times daily as needed for muscle spasms. 20 tablet 0   famotidine  (PEPCID ) 40 MG tablet Take 40 mg by mouth 2 (two) times daily.     gabapentin (NEURONTIN) 300 MG capsule Take 300 mg by mouth 4 (four) times daily.     HUMALOG KWIKPEN 100 UNIT/ML KwikPen      insulin glargine (LANTUS) 100 UNIT/ML injection Inject 40 Units into the skin at bedtime.     lisinopril-hydrochlorothiazide (ZESTORETIC) 20-12.5 MG tablet Take 1 tablet by mouth 2 (two) times daily.     metFORMIN (GLUCOPHAGE) 1000 MG tablet Take 1,000 mg by mouth 2 (two) times daily with a meal.     metoprolol  tartrate (LOPRESSOR ) 25 MG tablet Take 25 mg by mouth 2 (two) times daily.     montelukast (SINGULAIR) 10 MG tablet Take 10 mg by mouth daily.     SYMBICORT 160-4.5 MCG/ACT inhaler Inhale 2 puffs into the lungs 2 (two) times daily.     Rimegepant Sulfate (NURTEC) 75 MG TBDP Take 1 tablet (75 mg total) by mouth every other day. 16 tablet 6   azithromycin  (ZITHROMAX  Z-PAK) 250 MG tablet Take 2 pills (500mg ) first day and one pill (250mg ) the remaining 4 days. 6 tablet 0   benzonatate  (TESSALON ) 100 MG capsule Take 1 capsule (100 mg total) by mouth every 8 (eight) hours. 21 capsule 0   oxyCODONE  (ROXICODONE ) 5 MG immediate release tablet Take 1 tablet (5 mg total) by mouth every 4 (four) hours as needed for severe pain (pain score 7-10). 10 tablet 0   promethazine -dextromethorphan (PROMETHAZINE -DM) 6.25-15 MG/5ML syrup Take 5 mLs by mouth at bedtime as needed for  cough. 118 mL 0   No facility-administered medications prior to visit.    PAST MEDICAL HISTORY: Past Medical History:  Diagnosis Date   Asthma    Blood transfusion without reported diagnosis 03/2008   During Hysterectomy    Carpal tunnel syndrome    Diabetes mellitus without complication (HCC)    type 2   Fluid collection of middle ear    Gout    Herniated disc, cervical    Lumbar as well   History of vitamin D deficiency    Hyperlipidemia    Hypertension    Irregular heartbeat    Migraine    Neuropathy    Sciatica     PAST SURGICAL HISTORY: Past Surgical History:  Procedure Laterality Date   ABDOMINAL HYSTERECTOMY  2010   APPENDECTOMY  2010   CARPAL TUNNEL RELEASE  2008   CHOLECYSTECTOMY  1995   TRIGGER FINGER RELEASE     x 4   TUBAL LIGATION  1997    FAMILY HISTORY: Family History  Problem Relation Age of Onset   Hypertension Mother    Hypokalemia Mother    Gout Mother    Kidney disease Mother    Heart murmur Mother    Mental illness Father    Asthma Sister    Asthma Brother    Sleep apnea Paternal Aunt    Stroke Maternal Grandmother    Breast cancer Maternal Grandmother    Colon cancer Maternal Grandfather    Sleep apnea Daughter    Diabetes Maternal Great-grandmother     SOCIAL HISTORY: Social History   Socioeconomic History   Marital status: Legally Separated    Spouse name: Not on file   Number of children: 2   Years of education: Not on file   Highest education level: Associate degree: academic program  Occupational History  Not on file  Tobacco Use   Smoking status: Never   Smokeless tobacco: Never  Vaping Use   Vaping status: Never Used  Substance and Sexual Activity   Alcohol use: Not Currently    Comment: occassionally   Drug use: Never   Sexual activity: Not Currently    Birth control/protection: Surgical  Other Topics Concern   Not on file  Social History Narrative   Left handed   Caffeine- 1-2 cups    Lives at home  with daughter   Social Drivers of Health   Financial Resource Strain: Not at Risk (05/02/2022)   Received from [facility]    Financial Resource Strain: 1  Food Insecurity: Not at Risk (05/02/2022)   Received from [facility] Insecurity    Food: 1  Transportation Needs: Not at Risk (05/02/2022)   Received from [facility] Needs    Transportation: 1  Physical Activity: Not on File (07/14/2021)   Received [facility]   Physical Activity    Physical Activity: 0  Stress: Not on File (07/14/2021)   Received from [facility]   Stress    Stress: 0  Social Connections: Not on File (11/30/2022)   Received from [facility]   Social Connections    Connectedness: 0  Intimate Partner Violence: Not on file    PHYSICAL EXAM  Vitals:   02/28/24 0844  BP: 122/74  Pulse: 75  Resp: 15  SpO2: 98%  Height: 5' 4.5 (1.638 m)   Body mass index is 45.29 kg/m.  Generalized: Well developed, in no acute distress  Neurological examination  Mentation: Alert oriented to time, place, history taking. Follows all commands speech and language fluent Cranial nerve II-XII: Pupils were equal round reactive to light. Extraocular movements were full, visual field were full on confrontational test. Facial sensation and strength were normal. Head turning and shoulder shrug  were normal and symmetric. Motor: The motor testing reveals 5 over 5 strength of all 4 extremities. Good symmetric motor tone is noted throughout.  Sensory: Sensory testing is intact to soft touch on all 4 extremities. No evidence of extinction is noted.  Coordination: Cerebellar testing reveals good finger-nose-finger bilaterally Gait and station: Gait is normal.   DIAGNOSTIC DATA (LABS, IMAGING, TESTING) - I reviewed patient records, labs, notes, testing and imaging myself where available.  Lab Results  Component Value Date   WBC 6.5 12/09/2023   HGB 12.0 12/09/2023   HCT 38.3 12/09/2023   MCV 88.9 12/09/2023    PLT 344 12/09/2023      Component Value Date/Time   NA 139 12/09/2023 1244   NA 141 01/31/2022 0911   K 4.1 12/09/2023 1244   CL 103 12/09/2023 1244   CO2 24 12/09/2023 1244   GLUCOSE 152 (H) 12/09/2023 1244   BUN 15 12/09/2023 1244   BUN 9 01/31/2022 0911   CREATININE 1.10 (H) 12/09/2023 1244   CALCIUM 9.1 12/09/2023 1244   PROT 6.8 12/09/2023 1244   ALBUMIN 3.4 (L) 12/09/2023 1244   AST 20 12/09/2023 1244   ALT 12 12/09/2023 1244   ALKPHOS 86 12/09/2023 1244   BILITOT 0.5 12/09/2023 1244   GFRNONAA >60 12/09/2023 1244   GFRAA >60 09/22/2019 2023   No results found for: CHOL, HDL, LDLCALC, LDLDIRECT, TRIG, CHOLHDL No results found for: YHAJ8R No results found for: VITAMINB12 No results found for: TSH  Lauraine Born, AGNP-C, DNP 02/28/2024, 8:59 AM [facility] [address] [place], [place] [address] [phone]

## 2024-04-03 ENCOUNTER — Telehealth: Payer: Self-pay | Admitting: *Deleted

## 2024-04-03 NOTE — Telephone Encounter (Signed)
 error

## 2025-03-04 ENCOUNTER — Telehealth: Admitting: Neurology
